# Patient Record
Sex: Female | Born: 1990 | Hispanic: No | Marital: Married | Smoking: Never smoker
Health system: Southern US, Community
[De-identification: ages and names within clinical notes are randomized; demographics above are authoritative.]

## PROBLEM LIST (undated history)

## (undated) ENCOUNTER — Inpatient Hospital Stay (HOSPITAL_COMMUNITY): Payer: Self-pay

## (undated) DIAGNOSIS — D649 Anemia, unspecified: Secondary | ICD-10-CM

## (undated) DIAGNOSIS — I341 Nonrheumatic mitral (valve) prolapse: Secondary | ICD-10-CM

## (undated) DIAGNOSIS — I471 Supraventricular tachycardia, unspecified: Secondary | ICD-10-CM

## (undated) DIAGNOSIS — I498 Other specified cardiac arrhythmias: Secondary | ICD-10-CM

## (undated) DIAGNOSIS — R001 Bradycardia, unspecified: Secondary | ICD-10-CM

## (undated) HISTORY — DX: Supraventricular tachycardia, unspecified: I47.10

## (undated) HISTORY — DX: Bradycardia, unspecified: R00.1

## (undated) HISTORY — DX: Nonrheumatic mitral (valve) prolapse: I34.1

## (undated) HISTORY — DX: Supraventricular tachycardia: I47.1

## (undated) HISTORY — DX: Other specified cardiac arrhythmias: I49.8

## (undated) HISTORY — PX: NO PAST SURGERIES: SHX2092

## (undated) HISTORY — DX: Anemia, unspecified: D64.9

---
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
## 2011-01-26 LAB — RUBELLA ANTIBODY, IGM: Rubella: IMMUNE

## 2011-01-26 LAB — ABO/RH: RH Type: POSITIVE

## 2011-01-26 LAB — RPR: RPR: NONREACTIVE

## 2011-01-26 LAB — ANTIBODY SCREEN: Antibody Screen: NEGATIVE

## 2011-01-26 LAB — HEPATITIS B SURFACE ANTIGEN: Hepatitis B Surface Ag: NEGATIVE

## 2011-04-12 ENCOUNTER — Encounter (HOSPITAL_COMMUNITY): Payer: Self-pay | Admitting: Anesthesiology

## 2011-04-12 ENCOUNTER — Other Ambulatory Visit: Payer: Self-pay | Admitting: Obstetrics and Gynecology

## 2011-04-12 ENCOUNTER — Inpatient Hospital Stay (HOSPITAL_COMMUNITY): Payer: Medicaid Other | Admitting: Anesthesiology

## 2011-04-12 ENCOUNTER — Encounter (HOSPITAL_COMMUNITY): Payer: Self-pay

## 2011-04-12 ENCOUNTER — Inpatient Hospital Stay (HOSPITAL_COMMUNITY)
Admission: AD | Admit: 2011-04-12 | Discharge: 2011-04-14 | DRG: 774 | Disposition: A | Discharge status: Home / Self Care | Payer: Medicaid Other | Source: Ambulatory Visit | Attending: Obstetrics and Gynecology | Admitting: Obstetrics and Gynecology

## 2011-04-12 ENCOUNTER — Encounter (HOSPITAL_COMMUNITY): Payer: Self-pay | Admitting: *Deleted

## 2011-04-12 DIAGNOSIS — Z3490 Encounter for supervision of normal pregnancy, unspecified, unspecified trimester: Secondary | ICD-10-CM

## 2011-04-12 DIAGNOSIS — D62 Acute posthemorrhagic anemia: Secondary | ICD-10-CM | POA: Diagnosis not present

## 2011-04-12 DIAGNOSIS — O9903 Anemia complicating the puerperium: Secondary | ICD-10-CM | POA: Diagnosis not present

## 2011-04-12 DIAGNOSIS — O99019 Anemia complicating pregnancy, unspecified trimester: Secondary | ICD-10-CM

## 2011-04-12 DIAGNOSIS — O0930 Supervision of pregnancy with insufficient antenatal care, unspecified trimester: Secondary | ICD-10-CM

## 2011-04-12 LAB — URINALYSIS, ROUTINE W REFLEX MICROSCOPIC
Glucose, UA: NEGATIVE mg/dL
pH: 6 (ref 5.0–8.0)

## 2011-04-12 LAB — CBC
MCH: 29.8 pg (ref 26.0–34.0)
MCHC: 34.4 g/dL (ref 30.0–36.0)
MCV: 86.7 fL (ref 78.0–100.0)
Platelets: 207 10*3/uL (ref 150–400)
RBC: 3.92 MIL/uL (ref 3.87–5.11)
RDW: 14.9 % (ref 11.5–15.5)

## 2011-04-12 LAB — COMPREHENSIVE METABOLIC PANEL
ALT: 13 U/L (ref 0–35)
Albumin: 3 g/dL — ABNORMAL LOW (ref 3.5–5.2)
Alkaline Phosphatase: 326 U/L — ABNORMAL HIGH (ref 39–117)
Glucose, Bld: 90 mg/dL (ref 70–99)
Potassium: 3.3 mEq/L — ABNORMAL LOW (ref 3.5–5.1)
Sodium: 139 mEq/L (ref 135–145)
Total Protein: 6.5 g/dL (ref 6.0–8.3)

## 2011-04-12 LAB — RPR: RPR Ser Ql: NONREACTIVE

## 2011-04-12 MED ORDER — EPHEDRINE 5 MG/ML INJ
10.0000 mg | INTRAVENOUS | Status: DC | PRN
  Filled 2011-04-12: qty 4

## 2011-04-12 MED ORDER — LIDOCAINE HCL (PF) 1 % IJ SOLN
30.0000 mL | INTRAMUSCULAR | Status: DC | PRN
  Filled 2011-04-12: qty 30

## 2011-04-12 MED ORDER — LIDOCAINE HCL 1.5 % IJ SOLN
INTRAMUSCULAR | Status: DC | PRN
  Administered 2011-04-12 (×2): 4 mL via EPIDURAL

## 2011-04-12 MED ORDER — EPHEDRINE 5 MG/ML INJ: 10.0000 mg | INTRAVENOUS | Status: DC | PRN

## 2011-04-12 MED ORDER — ONDANSETRON HCL 4 MG/2ML IJ SOLN: 4.0000 mg | Freq: Four times a day (QID) | INTRAMUSCULAR | Status: DC | PRN

## 2011-04-12 MED ORDER — FENTANYL 2.5 MCG/ML BUPIVACAINE 1/10 % EPIDURAL INFUSION (WH - ANES)
14.0000 mL/h | INTRAMUSCULAR | Status: DC
  Filled 2011-04-12: qty 60

## 2011-04-12 MED ORDER — DIPHENHYDRAMINE HCL 50 MG/ML IJ SOLN: 12.5000 mg | INTRAMUSCULAR | Status: DC | PRN

## 2011-04-12 MED ORDER — ACETAMINOPHEN 325 MG PO TABS: 650.0000 mg | ORAL_TABLET | ORAL | Status: DC | PRN

## 2011-04-12 MED ORDER — LACTATED RINGERS IV SOLN
INTRAVENOUS | Status: DC
  Administered 2011-04-12: 17:00:00 via INTRAVENOUS

## 2011-04-12 MED ORDER — MISOPROSTOL 200 MCG PO TABS
ORAL_TABLET | ORAL | Status: AC
  Administered 2011-04-12: 1000 ug
  Filled 2011-04-12: qty 5

## 2011-04-12 MED ORDER — IBUPROFEN 600 MG PO TABS: 600.0000 mg | ORAL_TABLET | Freq: Four times a day (QID) | ORAL | Status: DC | PRN

## 2011-04-12 MED ORDER — OXYTOCIN 10 UNIT/ML IJ SOLN: 10.0000 [IU] | Freq: Once | INTRAMUSCULAR | Status: DC

## 2011-04-12 MED ORDER — OXYTOCIN 10 UNIT/ML IJ SOLN
INTRAMUSCULAR | Status: AC
  Administered 2011-04-12: 10 [IU]
  Filled 2011-04-12: qty 2

## 2011-04-12 MED ORDER — CITRIC ACID-SODIUM CITRATE 334-500 MG/5ML PO SOLN: 30.0000 mL | ORAL | Status: DC | PRN

## 2011-04-12 MED ORDER — OXYTOCIN BOLUS FROM INFUSION
500.0000 mL | Freq: Once | INTRAVENOUS | Status: DC
  Filled 2011-04-12: qty 500
  Filled 2011-04-12: qty 1000

## 2011-04-12 MED ORDER — LACTATED RINGERS IV SOLN: 500.0000 mL | INTRAVENOUS | Status: DC | PRN

## 2011-04-12 MED ORDER — BUTORPHANOL TARTRATE 2 MG/ML IJ SOLN: 2.0000 mg | INTRAMUSCULAR | Status: DC | PRN

## 2011-04-12 MED ORDER — OXYCODONE-ACETAMINOPHEN 5-325 MG PO TABS: 2.0000 | ORAL_TABLET | ORAL | Status: DC | PRN

## 2011-04-12 MED ORDER — PHENYLEPHRINE 40 MCG/ML (10ML) SYRINGE FOR IV PUSH (FOR BLOOD PRESSURE SUPPORT)
80.0000 ug | PREFILLED_SYRINGE | INTRAVENOUS | Status: DC | PRN
  Filled 2011-04-12: qty 5

## 2011-04-12 MED ORDER — SODIUM CHLORIDE 0.9 % IJ SOLN: 3.0000 mL | Freq: Two times a day (BID) | INTRAMUSCULAR | Status: DC

## 2011-04-12 MED ORDER — FENTANYL 2.5 MCG/ML BUPIVACAINE 1/10 % EPIDURAL INFUSION (WH - ANES)
INTRAMUSCULAR | Status: DC | PRN
  Administered 2011-04-12: 14 mL/h via EPIDURAL

## 2011-04-12 MED ORDER — LACTATED RINGERS IV SOLN
500.0000 mL | Freq: Once | INTRAVENOUS | Status: AC
  Administered 2011-04-12: 500 mL via INTRAVENOUS

## 2011-04-12 MED ORDER — PHENYLEPHRINE 40 MCG/ML (10ML) SYRINGE FOR IV PUSH (FOR BLOOD PRESSURE SUPPORT)
80.0000 ug | PREFILLED_SYRINGE | INTRAVENOUS | Status: DC | PRN
  Filled 2011-04-12 (×2): qty 5

## 2011-04-12 MED ORDER — OXYTOCIN 20 UNITS IN LACTATED RINGERS INFUSION - SIMPLE: 125.0000 mL/h | Freq: Once | INTRAVENOUS | Status: DC

## 2011-04-12 NOTE — H&P (Signed)
Katelyn Fuentes is a 20 y.o. female presenting for CC of SROM at 1330, states she had a large gush of clear fluid and then continued to have leaking, appears grossly ruptured and amnisure is pos. She reports ctx are coming more frequent and are stronger now, she reports scant bloody show and pos FM.  Pregnancy has been uncomplicated.   HPI: She was seen at [facility] for prenatal care at 27wks, she had a routine anatomy scan and then a repeat anatomy for additional views, it was otherwise normal. She did not have any other previous [facility] aside from an US done in the ER in [place], that confirmed EDC of 12/27. She had a 1hr gtt that was normal.   Maternal Medical History:  Reason for admission: Reason for admission: rupture of membranes.  srom clear fluid at 1330  Contractions: Onset was 1-2 hours ago.   Frequency: regular.   Perceived severity is moderate.    Fetal activity: Perceived fetal activity is normal.   Last perceived fetal movement was within the past hour.    Prenatal complications: no prenatal complications   OB History    Grav Para Term Preterm Abortions TAB SAB Ect Mult Living   1 0             Past Medical History  Diagnosis Date  . Asthma     childhood   Past Surgical History  Procedure Date  . No past surgeries    Family History: family history is not on file. Heart dx - MGM Father - MI CHTN - MGM Diabetes - MA Seizures/stroke - PGF, PGM, MGM,  Social History:  reports that she has never smoked. She has never used smokeless tobacco. She reports that she does not drink alcohol or use illicit drugs. Pt is single Building control surveyor, "Katelyn Fuentes" FOB is involved and supportive. She is currently unemployed with 1 yr college education.   Review of Systems  All other systems reviewed and are negative.    Dilation: 3 Effacement (%): 100 Station: 0 Exam by:: Shelly Jahanna Raether, cnm Blood pressure 143/91, pulse 81, temperature 98.3 F (36.8 C), temperature  source Oral, resp. rate 18, height 5\' 5"  (1.651 m), weight 67.586 kg (149 lb). Maternal Exam:  Uterine Assessment: Contraction strength is moderate.  Contraction frequency is regular.   Abdomen: Fundal height is aga.   Estimated fetal weight is 7-11.   Fetal presentation: vertex  Introitus: Normal vulva. Normal vagina.  Ferning test: not done.  Nitrazine test: not done. Amniotic fluid character: clear. Copious clear fluid, amniosure pos  Pelvis: adequate for delivery.   Cervix: Cervix evaluated by digital exam.     Fetal Exam Fetal Monitor Review: Mode: ultrasound.   Baseline rate: 140.  Variability: moderate (6-25 bpm).   Pattern: accelerations present and early decelerations.    Fetal State Assessment: Category I - tracings are normal.     Physical Exam  Nursing note and vitals reviewed. Constitutional: She is oriented to person, place, and time. She appears well-developed and well-nourished. No distress.       Grimaces with some ctx, responded to coaching and relaxation techniques  Neck: Normal range of motion.  Cardiovascular: Normal rate.   Respiratory: Effort normal.  GI: Soft. There is no tenderness.  Genitourinary: Vagina normal.       Pelvic =3/100/0 vtx  Musculoskeletal: Normal range of motion. She exhibits no edema.  Neurological: She is alert and oriented to person, place, and time. She has normal reflexes.  Skin: Skin is warm and dry.  Psychiatric: She has a normal mood and affect. Her behavior is normal. Judgment and thought content normal.    Prenatal labs: ABO, Rh: B/Positive/-- (09/27 0000) Antibody: Negative (09/27 0000) Rubella: Immune (09/27 0000) RPR: Nonreactive (09/27 0000)  HBsAg: Negative (09/27 0000)  HIV: Non-reactive (09/27 0000)  GBS:   neg 1hr gtt =134   Assessment/Plan: IUP at [redacted]w[redacted]d SROM/early labor GBS neg FHR reassuring with occ mild early variables  Admit to birthing suites Routine CNM orders - Dr Stefano Gaul attending    Stadol PRN Anticipate NSVD   Brailey Buescher M 04/12/2011, 5:11 PM

## 2011-04-12 NOTE — Progress Notes (Addendum)
Patient ID: SRAH AKE, female   DOB: 28-Apr-1991, 20 y.o.   MRN: [id number] .Subjective:  Doing well, has been ambulating and using ball, desires epidural, family supportive at bs  Objective: BP 144/81  Pulse 77  Temp(Src) 98.1 F (36.7 C) (Oral)  Resp 18  Ht 5\' 5"  (1.651 m)  Wt 67.586 kg (149 lb)  BMI 24.79 kg/m2   FHT:  FHR: 120 bpm, variability: moderate,  accelerations:  Present,  decelerations:  Present occ early mild variables UC:   regular, every 2-4 minutes SVE:   Dilation: 3 Effacement (%): 100 Station: 0 Exam by:: [employer], cnm    Assessment / Plan: Spontaneous labor, progressing normally GBS neg Borderline BP's Platelets 207 PIH labs pending   Fetal Wellbeing:  Category I Pain Control:  Labor support without medications and Epidural  Will continue to observe BP closeley  Dr Stefano Gaul updated  Malissa Hippo 04/12/2011, 6:53 PM

## 2011-04-12 NOTE — Progress Notes (Signed)
Patient ID: LIANNA SITZMANN, female   DOB: 03/08/1991, 20 y.o.   MRN: [id number] .Subjective: Comfortable now with epidural,    Objective: BP 139/88  Pulse 89  Temp(Src) 98.4 F (36.9 C) (Oral)  Resp 18  Ht 5\' 5"  (1.651 m)  Wt 67.586 kg (149 lb)  BMI 24.79 kg/m2  SpO2 100%     FHT:  FHR: 130 bpm, variability: moderate,  accelerations:  Present,  decelerations:  Absent UC:   regular, every 2-3 minutes SVE:   Dilation: 9 Effacement (%): 100 Station: 0 Exam by:: Almond Lint, CNM    Assessment / Plan: Spontaneous labor, progressing normally Mildly elevated BP, PIH labs normal Will collect 24hr urine Recheck cervix 1-2hr   Fetal Wellbeing:  Category I Pain Control:  Epidural  Update physician PRN  Malissa Hippo 04/12/2011, 8:14 PM

## 2011-04-12 NOTE — Anesthesia Preprocedure Evaluation (Signed)
Anesthesia Evaluation  Patient identified by MRN, date of birth, ID band Patient awake    Reviewed: Allergy & Precautions, H&P , Patient's Chart, lab work & pertinent test results  Airway Mallampati: III TM Distance: >3 FB Neck ROM: full    Dental No notable dental hx. (+) Teeth Intact   Pulmonary neg pulmonary ROS,  clear to auscultation  Pulmonary exam normal       Cardiovascular neg cardio ROS regular Normal    Neuro/Psych Negative Neurological ROS  Negative Psych ROS   GI/Hepatic negative GI ROS, Neg liver ROS,   Endo/Other  Negative Endocrine ROS  Renal/GU negative Renal ROS  Genitourinary negative   Musculoskeletal   Abdominal Normal abdominal exam  (+)   Peds  Hematology negative hematology ROS (+)   Anesthesia Other Findings   Reproductive/Obstetrics (+) Pregnancy                           Anesthesia Physical Anesthesia Plan  ASA: II  Anesthesia Plan: Epidural   Post-op Pain Management:    Induction:   Airway Management Planned:   Additional Equipment:   Intra-op Plan:   Post-operative Plan:   Informed Consent: I have reviewed the patients History and Physical, chart, labs and discussed the procedure including the risks, benefits and alternatives for the proposed anesthesia with the patient or authorized representative who has indicated his/her understanding and acceptance.     Plan Discussed with: Anesthesiologist  Anesthesia Plan Comments:         Anesthesia Quick Evaluation

## 2011-04-12 NOTE — Anesthesia Procedure Notes (Signed)
Epidural Patient location during procedure: OB Start time: 04/12/2011 6:55 PM  Staffing Anesthesiologist: Lyna Laningham A. Performed by: anesthesiologist   Preanesthetic Checklist Completed: patient identified, site marked, surgical consent, pre-op evaluation, timeout performed, IV checked, risks and benefits discussed and monitors and equipment checked  Epidural Patient position: sitting Prep: site prepped and draped and DuraPrep Patient monitoring: continuous pulse ox and blood pressure Approach: midline Injection technique: LOR air  Needle:  Needle type: Tuohy  Needle gauge: 17 G Needle length: 9 cm Needle insertion depth: 4 cm Catheter type: closed end flexible Catheter size: 19 Gauge Catheter at skin depth: 9 cm Test dose: negative and 1.5% lidocaine  Assessment Events: blood not aspirated, injection not painful, no injection resistance, negative IV test and no paresthesia  Additional Notes Patient is more comfortable after epidural dosed. Please see RN's note for documentation of vital signs and FHR which are stable.

## 2011-04-13 LAB — CBC
HCT: 24.4 % — ABNORMAL LOW (ref 36.0–46.0)
Hemoglobin: 8.4 g/dL — ABNORMAL LOW (ref 12.0–15.0)
MCV: 86.8 fL (ref 78.0–100.0)
RDW: 14.7 % (ref 11.5–15.5)
WBC: 12.2 10*3/uL — ABNORMAL HIGH (ref 4.0–10.5)

## 2011-04-13 MED ORDER — LANOLIN HYDROUS EX OINT: TOPICAL_OINTMENT | CUTANEOUS | Status: DC | PRN

## 2011-04-13 MED ORDER — MEASLES, MUMPS & RUBELLA VAC ~~LOC~~ INJ
0.5000 mL | INJECTION | Freq: Once | SUBCUTANEOUS | Status: DC
  Filled 2011-04-13: qty 0.5

## 2011-04-13 MED ORDER — OXYCODONE-ACETAMINOPHEN 5-325 MG PO TABS: 1.0000 | ORAL_TABLET | ORAL | Status: DC | PRN

## 2011-04-13 MED ORDER — ZOLPIDEM TARTRATE 5 MG PO TABS: 5.0000 mg | ORAL_TABLET | Freq: Every evening | ORAL | Status: DC | PRN

## 2011-04-13 MED ORDER — SIMETHICONE 80 MG PO CHEW: 80.0000 mg | CHEWABLE_TABLET | ORAL | Status: DC | PRN

## 2011-04-13 MED ORDER — SENNOSIDES-DOCUSATE SODIUM 8.6-50 MG PO TABS
2.0000 | ORAL_TABLET | Freq: Every day | ORAL | Status: DC
  Administered 2011-04-13: 2 via ORAL

## 2011-04-13 MED ORDER — DIBUCAINE 1 % RE OINT: 1.0000 "application " | TOPICAL_OINTMENT | RECTAL | Status: DC | PRN

## 2011-04-13 MED ORDER — IBUPROFEN 600 MG PO TABS
600.0000 mg | ORAL_TABLET | Freq: Four times a day (QID) | ORAL | Status: DC
  Administered 2011-04-13 – 2011-04-14 (×5): 600 mg via ORAL
  Filled 2011-04-13 (×5): qty 1

## 2011-04-13 MED ORDER — OXYTOCIN 20 UNITS IN LACTATED RINGERS INFUSION - SIMPLE: 999.0000 mL/h | INTRAVENOUS | Status: DC

## 2011-04-13 MED ORDER — PRENATAL PLUS 27-1 MG PO TABS
1.0000 | ORAL_TABLET | Freq: Every day | ORAL | Status: DC
  Administered 2011-04-13 – 2011-04-14 (×2): 1 via ORAL
  Filled 2011-04-13 (×2): qty 1

## 2011-04-13 MED ORDER — BENZOCAINE-MENTHOL 20-0.5 % EX AERO: 1.0000 "application " | INHALATION_SPRAY | CUTANEOUS | Status: DC | PRN

## 2011-04-13 MED ORDER — WITCH HAZEL-GLYCERIN EX PADS: 1.0000 "application " | MEDICATED_PAD | CUTANEOUS | Status: DC | PRN

## 2011-04-13 MED ORDER — MISOPROSTOL 200 MCG PO TABS
1000.0000 ug | ORAL_TABLET | Freq: Once | ORAL | Status: DC
  Filled 2011-04-13: qty 5

## 2011-04-13 MED ORDER — TETANUS-DIPHTH-ACELL PERTUSSIS 5-2.5-18.5 LF-MCG/0.5 IM SUSP: 0.5000 mL | Freq: Once | INTRAMUSCULAR | Status: DC

## 2011-04-13 MED ORDER — DIPHENHYDRAMINE HCL 25 MG PO CAPS: 25.0000 mg | ORAL_CAPSULE | Freq: Four times a day (QID) | ORAL | Status: DC | PRN

## 2011-04-13 MED ORDER — ONDANSETRON HCL 4 MG/2ML IJ SOLN: 4.0000 mg | INTRAMUSCULAR | Status: DC | PRN

## 2011-04-13 MED ORDER — FERROUS SULFATE 325 (65 FE) MG PO TABS
325.0000 mg | ORAL_TABLET | Freq: Two times a day (BID) | ORAL | Status: DC
  Administered 2011-04-13 – 2011-04-14 (×2): 325 mg via ORAL
  Filled 2011-04-13 (×2): qty 1

## 2011-04-13 MED ORDER — ONDANSETRON HCL 4 MG PO TABS: 4.0000 mg | ORAL_TABLET | ORAL | Status: DC | PRN

## 2011-04-13 NOTE — Anesthesia Postprocedure Evaluation (Signed)
Anesthesia Post Note  Patient: Katelyn Fuentes  Procedure(s) Performed: * No procedures listed *  Anesthesia type: Epidural  Patient location: Mother/Baby  Post pain: Pain level controlled  Post assessment: Post-op Vital signs reviewed  Last Vitals:  Filed Vitals:   04/13/11 1447  BP: 108/74  Pulse: 118  Temp:   Resp:     Post vital signs: Reviewed  Level of consciousness: awake  Complications: No apparent anesthesia complications

## 2011-04-13 NOTE — Progress Notes (Signed)
Post Partum Day 1 Subjective: complaints of a little dizzy when up no shower yet  Objective: Blood pressure 100/65, pulse 101, temperature 98.7 F (37.1 C), temperature source Oral, resp. rate 18, height 5\' 5"  (1.651 m), weight 67.586 kg (149 lb), SpO2 99.00%, unknown if currently breastfeeding.  Physical Exam:  General: alert and cooperative Lochia: appropriate Uterine Fundus: firm Incision:none DVT Evaluation: Negative Homan's sign.   Basename 04/13/11 0515 04/12/11 1725  HGB 8.4* 11.7*  HCT 24.4* 34.0*    Assessment/Plan: Hx anemia 2ndary to PP hemorrhage Hemodynamically stable Plan  Orthostatic bps, hgb in am, iron   LOS: 1 day   Ahna Konkle 04/13/2011, 12:56 PM

## 2011-04-13 NOTE — Progress Notes (Signed)
UR Chart review completed.  

## 2011-04-14 DIAGNOSIS — O99019 Anemia complicating pregnancy, unspecified trimester: Secondary | ICD-10-CM

## 2011-04-14 LAB — CBC
Hemoglobin: 7.6 g/dL — ABNORMAL LOW (ref 12.0–15.0)
MCH: 30 pg (ref 26.0–34.0)
MCHC: 34.2 g/dL (ref 30.0–36.0)

## 2011-04-14 MED ORDER — DESOGESTREL-ETHINYL ESTRADIOL 0.15-0.02/0.01 MG (21/5) PO TABS: 1.0000 | ORAL_TABLET | Freq: Every day | ORAL | Status: DC

## 2011-04-14 MED ORDER — IBUPROFEN 600 MG PO TABS: 600.0000 mg | ORAL_TABLET | Freq: Four times a day (QID) | ORAL | Status: AC | PRN

## 2011-04-14 NOTE — Discharge Summary (Signed)
Obstetric Discharge Summary Reason for Admission: onset of labor Prenatal Procedures: ultrasound Intrapartum Procedures: spontaneous vaginal delivery Postpartum Procedures: none Complications-Operative and Postpartum: hemorrhage and anemia Hemoglobin  Date Value Range Status  04/14/2011 7.6* 12.0-15.0 (g/dL) Final     HCT  Date Value Range Status  04/14/2011 22.2* 36.0-46.0 (%) Final   Hospital course: spontaneous labor, SVD, with pp hemorrhage, no laceration, nonlactating, offered blood transfusion, declined, PE WNL, Using PO pain medication with benefit. Discharge Diagnoses: Term Pregnancy-delivered and anemia  Discharge Information: Date: 04/14/2011 Activity: pelvic rest Diet: routine Medications: Ibuprofen, Iron and Kariva Condition: stable Instructions: refer to practice specific booklet Discharge to: home Contraception: OCP Follow-up Information    Follow up with [facility]. Make an appointment in 6 weeks.         Newborn Data: Live born female  Birth Weight: 6 lb 15.6 oz (3165 g) APGAR: 8, 9  Home with mother.  Nigel Bridgeman 04/14/2011, 11:41 AM

## 2011-04-14 NOTE — Addendum Note (Signed)
Addendum  created 04/14/11 1610 by Cephus Shelling   Modules edited:Charges VN, Notes Section

## 2011-04-14 NOTE — Addendum Note (Signed)
Addendum  created 04/14/11 0820 by Cephus Shelling   Modules edited:Charges VN, Notes Section

## 2011-04-14 NOTE — Anesthesia Postprocedure Evaluation (Signed)
  Anesthesia Post Note  Patient: Katelyn Fuentes  Procedure(s) Performed: * No procedures listed *  Anesthesia type: Epidural  Patient location: Mother/Baby  Post pain: Pain level controlled  Post assessment: Post-op Vital signs reviewed  Last Vitals:  Filed Vitals:   04/14/11 0558  BP: 106/69  Pulse: 86  Temp: 36.6 C  Resp: 18    Post vital signs: Reviewed  Level of consciousness:alert  Complications: No apparent anesthesia complications

## 2011-06-27 ENCOUNTER — Encounter (INDEPENDENT_AMBULATORY_CARE_PROVIDER_SITE_OTHER): Payer: Medicaid Other | Admitting: Obstetrics and Gynecology

## 2011-06-27 DIAGNOSIS — Z30017 Encounter for initial prescription of implantable subdermal contraceptive: Secondary | ICD-10-CM

## 2011-06-27 DIAGNOSIS — Z3049 Encounter for surveillance of other contraceptives: Secondary | ICD-10-CM

## 2011-07-05 ENCOUNTER — Encounter: Payer: Medicaid Other | Admitting: Obstetrics and Gynecology

## 2011-11-18 ENCOUNTER — Encounter (HOSPITAL_COMMUNITY): Payer: Self-pay | Admitting: Emergency Medicine

## 2011-11-18 ENCOUNTER — Emergency Department (HOSPITAL_COMMUNITY)
Admission: EM | Admit: 2011-11-18 | Discharge: 2011-11-18 | Disposition: A | Discharge status: Home / Self Care | Payer: Medicaid Other | Attending: Emergency Medicine | Admitting: Emergency Medicine

## 2011-11-18 DIAGNOSIS — K120 Recurrent oral aphthae: Secondary | ICD-10-CM | POA: Insufficient documentation

## 2011-11-18 MED ORDER — LIDOCAINE VISCOUS 2 % MT SOLN: 20.0000 mL | OROMUCOSAL | Status: AC | PRN

## 2011-11-18 NOTE — ED Notes (Signed)
Pt reports a bump to right inner jaw on Wednesday that has increased in size.  Pt denies drainage to lesion.  Pt denies chills or fever.  Pt reports itching to outer right cheek.

## 2011-11-18 NOTE — ED Notes (Signed)
Pt lesions to right inner jaw

## 2011-11-18 NOTE — ED Notes (Signed)
Pt verbalizes understanding 

## 2011-11-18 NOTE — ED Notes (Signed)
Pain right side inner cheek, on exam, pt has small ulcerated area adjacent to lower wisdom tooth. Rx'ed w/ Advill and Aleve, as well as orajel extra strenght, w/o relief.

## 2011-11-22 NOTE — ED Provider Notes (Signed)
History/physical exam/procedure(s) were performed by non-physician practitioner and as supervising physician I was immediately available for consultation/collaboration. I have reviewed all notes and am in agreement with care and plan.   Brewer Hitchman S Mattie Novosel, MD 11/22/11 1514 

## 2011-11-22 NOTE — ED Provider Notes (Signed)
History     CSN: [id number]  Arrival date & time 11/18/11  1359   First MD Initiated Contact with Patient 11/18/11 1633      Chief Complaint  Patient presents with  . Mouth Lesions    (Consider location/radiation/quality/duration/timing/severity/associated sxs/prior treatment) Patient is a 21 y.o. female presenting with mouth sores. The history is provided by the patient and a friend.  Mouth Lesions  The current episode started 5 to 7 days ago. The onset was gradual. The problem occurs rarely. The problem has been gradually worsening. The problem is mild. The symptoms are relieved by one or more OTC medications. The symptoms are aggravated by eating. Associated symptoms include mouth sores. Pertinent negatives include no fever, no nausea, no vomiting, no sore throat, no swollen glands and no neck pain.  21yo female with canker sore to R inner cheek x several days. Used oragel and aleve with minimal relief.  No fever n/v.  Nontoxic appearance.   Past Medical History  Diagnosis Date  . Asthma     childhood    Past Surgical History  Procedure Date  . No past surgeries     No family history on file.  History  Substance Use Topics  . Smoking status: Never Smoker   . Smokeless tobacco: Never Used  . Alcohol Use: No    OB History    Grav Para Term Preterm Abortions TAB SAB Ect Mult Living   1 1 1       1       Review of Systems  Constitutional: Negative for fever.  HENT: Positive for mouth sores. Negative for sore throat, trouble swallowing and neck pain.   Gastrointestinal: Negative for nausea and vomiting.    Allergies  Peach seed  Home Medications   Current Outpatient Rx  Name Route Sig Dispense Refill  . NAPROXEN SODIUM 220 MG PO TABS Oral Take 220 mg by mouth as needed. Pain    . LIDOCAINE VISCOUS 2 % MT SOLN Oral Take 20 mLs by mouth as needed for pain (swish and spit before eating). 100 mL 0    BP 109/58  Pulse 55  Temp 97.9 F (36.6 C) (Oral)  Resp  12  SpO2 100%  LMP 10/29/2011  Breastfeeding? No  Physical Exam  HENT:  Head: Normocephalic and atraumatic.  Mouth/Throat: Uvula is midline, oropharynx is clear and moist and mucous membranes are normal.    ED Course  Procedures (including critical care time)  Labs Reviewed - No data to display No results found.   1. Canker sores oral       MDM  Canker sore x several days. Contiue advil and use lidocaine 2% prior to eating.  Follow up with pcp of choice as needed.        Remi Haggard, NP 11/22/11 1338

## 2012-02-16 ENCOUNTER — Telehealth: Payer: Self-pay | Admitting: Obstetrics and Gynecology

## 2012-02-21 ENCOUNTER — Encounter: Payer: Medicaid Other | Admitting: Obstetrics and Gynecology

## 2012-09-24 ENCOUNTER — Encounter (HOSPITAL_COMMUNITY): Payer: Self-pay | Admitting: Emergency Medicine

## 2012-09-24 ENCOUNTER — Emergency Department (HOSPITAL_COMMUNITY)
Admission: EM | Admit: 2012-09-24 | Discharge: 2012-09-24 | Disposition: A | Discharge status: Home / Self Care | Payer: Medicaid Other | Attending: Emergency Medicine | Admitting: Emergency Medicine

## 2012-09-24 DIAGNOSIS — Z3202 Encounter for pregnancy test, result negative: Secondary | ICD-10-CM | POA: Insufficient documentation

## 2012-09-24 DIAGNOSIS — R3915 Urgency of urination: Secondary | ICD-10-CM | POA: Insufficient documentation

## 2012-09-24 DIAGNOSIS — J45909 Unspecified asthma, uncomplicated: Secondary | ICD-10-CM | POA: Insufficient documentation

## 2012-09-24 DIAGNOSIS — Z791 Long term (current) use of non-steroidal anti-inflammatories (NSAID): Secondary | ICD-10-CM | POA: Insufficient documentation

## 2012-09-24 DIAGNOSIS — R350 Frequency of micturition: Secondary | ICD-10-CM | POA: Insufficient documentation

## 2012-09-24 DIAGNOSIS — N390 Urinary tract infection, site not specified: Secondary | ICD-10-CM | POA: Insufficient documentation

## 2012-09-24 DIAGNOSIS — Z79899 Other long term (current) drug therapy: Secondary | ICD-10-CM | POA: Insufficient documentation

## 2012-09-24 DIAGNOSIS — R319 Hematuria, unspecified: Secondary | ICD-10-CM | POA: Insufficient documentation

## 2012-09-24 LAB — URINE MICROSCOPIC-ADD ON

## 2012-09-24 LAB — URINALYSIS, ROUTINE W REFLEX MICROSCOPIC
Glucose, UA: NEGATIVE mg/dL
Ketones, ur: NEGATIVE mg/dL
Protein, ur: 100 mg/dL — AB

## 2012-09-24 MED ORDER — CEPHALEXIN 500 MG PO CAPS: 500.0000 mg | ORAL_CAPSULE | Freq: Four times a day (QID) | ORAL | Status: DC

## 2012-09-24 NOTE — ED Provider Notes (Signed)
History     CSN: [id number]  Arrival date & time 09/24/12  1207   First MD Initiated Contact with Patient 09/24/12 1321      Chief Complaint  Patient presents with  . Dysuria    (Consider location/radiation/quality/duration/timing/severity/associated sxs/prior treatment) HPI Comments: 22 year old female presents to the emergency department complaining of increased urinary frequency, urgency and dysuria x10 days. Admits to associated suprapubic discomfort upon urination. Initially her lower back was hurting, rated 6/10, however this symptom has since began to resolve. She has not tried any alleviating factors for her pain. States she is urinating twice every hour. Admits to a few episodes of hematuria. No nausea or vomiting, fever or chills. She is currently on her menstrual cycle. Denies vaginal bleeding, pain or discharge.  Patient is a 22 y.o. female presenting with dysuria. The history is provided by the patient.  Dysuria Associated symptoms: no fever, no nausea, no vaginal discharge and no vomiting     Past Medical History  Diagnosis Date  . Asthma     childhood    Past Surgical History  Procedure Laterality Date  . No past surgeries      No family history on file.  History  Substance Use Topics  . Smoking status: Never Smoker   . Smokeless tobacco: Never Used  . Alcohol Use: No    OB History   Grav Para Term Preterm Abortions TAB SAB Ect Mult Living   1 1 1       1       Review of Systems  Constitutional: Negative for fever and chills.  Gastrointestinal: Negative for nausea and vomiting.  Genitourinary: Positive for dysuria, urgency, frequency and hematuria. Negative for vaginal discharge and vaginal pain.  All other systems reviewed and are negative.    Allergies  Peach seed  Home Medications   Current Outpatient Rx  Name  Route  Sig  Dispense  Refill  . ferrous sulfate 325 (65 FE) MG tablet   Oral   Take 325 mg by mouth daily with breakfast.          . naproxen sodium (ANAPROX) 220 MG tablet   Oral   Take 220 mg by mouth as needed. Pain         . prenatal vitamin w/FE, FA (PRENATAL 1 + 1) 27-1 MG TABS   Oral   Take 1 tablet by mouth daily at 12 noon.           BP 110/66  Pulse 84  Temp(Src) 98.5 F (36.9 C) (Oral)  Resp 16  SpO2 100%  LMP 09/24/2012  Physical Exam  Nursing note and vitals reviewed. Constitutional: She is oriented to person, place, and time. She appears well-developed and well-nourished. No distress.  HENT:  Head: Normocephalic and atraumatic.  Mouth/Throat: Oropharynx is clear and moist.  Eyes: Conjunctivae are normal.  Neck: Normal range of motion. Neck supple.  Cardiovascular: Normal rate, regular rhythm and normal heart sounds.   Pulmonary/Chest: Effort normal and breath sounds normal.  Abdominal: Soft. Normal appearance and bowel sounds are normal. She exhibits no distension and no mass. There is tenderness in the suprapubic area. There is CVA tenderness (bilateral, mild). There is no rigidity, no rebound and no guarding.  Musculoskeletal: Normal range of motion. She exhibits no edema.  Neurological: She is alert and oriented to person, place, and time.  Skin: Skin is warm and dry. She is not diaphoretic.  Psychiatric: She has a normal mood and affect. Her behavior  is normal.    ED Course  Procedures (including critical care time)  Labs Reviewed  URINALYSIS, ROUTINE W REFLEX MICROSCOPIC - Abnormal; Notable for the following:    Color, Urine AMBER (*)    APPearance TURBID (*)    Hgb urine dipstick LARGE (*)    Protein, ur 100 (*)    Leukocytes, UA LARGE (*)    All other components within normal limits  URINE MICROSCOPIC-ADD ON - Abnormal; Notable for the following:    Squamous Epithelial / LPF FEW (*)    Bacteria, UA FEW (*)    All other components within normal limits  URINE CULTURE  POCT PREGNANCY, URINE   No results found.   1. Urinary tract infection       MDM   22 year old female with uncomplicated urinary tract infection. She is in no apparent distress with normal vital signs. Rx Keflex. Advised her to stay well-hydrated. Return precautions discussed. She will followup with her primary care physician in one week. Patient states understanding of plan and is agreeable.        Trevor Mace, PA-C 09/24/12 1410

## 2012-09-24 NOTE — ED Notes (Signed)
Pt states that she has been having dysuria and frequency x over a week.

## 2012-09-24 NOTE — ED Provider Notes (Signed)
Medical screening examination/treatment/procedure(s) were performed by non-physician practitioner and as supervising physician I was immediately available for consultation/collaboration.  Gilda Crease, MD 09/24/12 [phone]

## 2012-09-26 LAB — URINE CULTURE

## 2012-09-27 NOTE — ED Notes (Signed)
Post ED Visit - Positive Culture Follow-up  Culture report reviewed by antimicrobial stewardship pharmacist: []  Wes Dulaney, Pharm.D., BCPS []  Celedonio Miyamoto, Pharm.D., BCPS [x]  Georgina Pillion, [address].D., BCPS []  [place], [address].D., BCPS, AAHIVP []  Estella Husk, Pharm.D., BCPS, AAHIV  Positive urine culture Treated with cephalexin, organism sensitive to the same and no further patient follow-up is required at this time.  Larena Sox 09/27/2012, 3:01 PM

## 2012-12-17 DIAGNOSIS — F419 Anxiety disorder, unspecified: Secondary | ICD-10-CM | POA: Insufficient documentation

## 2012-12-17 DIAGNOSIS — F32A Depression, unspecified: Secondary | ICD-10-CM | POA: Insufficient documentation

## 2012-12-17 DIAGNOSIS — F329 Major depressive disorder, single episode, unspecified: Secondary | ICD-10-CM | POA: Insufficient documentation

## 2014-03-02 ENCOUNTER — Encounter (HOSPITAL_COMMUNITY): Payer: Self-pay | Admitting: Emergency Medicine

## 2014-12-04 ENCOUNTER — Emergency Department (HOSPITAL_COMMUNITY): Payer: Medicaid Other

## 2014-12-04 ENCOUNTER — Encounter (HOSPITAL_COMMUNITY): Payer: Self-pay | Admitting: Emergency Medicine

## 2014-12-04 ENCOUNTER — Emergency Department (HOSPITAL_COMMUNITY)
Admission: EM | Admit: 2014-12-04 | Discharge: 2014-12-04 | Disposition: A | Discharge status: Home / Self Care | Payer: Medicaid Other | Attending: Emergency Medicine | Admitting: Emergency Medicine

## 2014-12-04 DIAGNOSIS — R55 Syncope and collapse: Secondary | ICD-10-CM | POA: Insufficient documentation

## 2014-12-04 DIAGNOSIS — R110 Nausea: Secondary | ICD-10-CM | POA: Insufficient documentation

## 2014-12-04 DIAGNOSIS — R531 Weakness: Secondary | ICD-10-CM | POA: Diagnosis not present

## 2014-12-04 DIAGNOSIS — Z792 Long term (current) use of antibiotics: Secondary | ICD-10-CM | POA: Diagnosis not present

## 2014-12-04 DIAGNOSIS — Z3202 Encounter for pregnancy test, result negative: Secondary | ICD-10-CM | POA: Insufficient documentation

## 2014-12-04 DIAGNOSIS — R51 Headache: Secondary | ICD-10-CM | POA: Diagnosis not present

## 2014-12-04 DIAGNOSIS — Z79899 Other long term (current) drug therapy: Secondary | ICD-10-CM | POA: Diagnosis not present

## 2014-12-04 DIAGNOSIS — R0789 Other chest pain: Secondary | ICD-10-CM | POA: Diagnosis not present

## 2014-12-04 DIAGNOSIS — R42 Dizziness and giddiness: Secondary | ICD-10-CM | POA: Diagnosis not present

## 2014-12-04 DIAGNOSIS — J45909 Unspecified asthma, uncomplicated: Secondary | ICD-10-CM | POA: Insufficient documentation

## 2014-12-04 DIAGNOSIS — R002 Palpitations: Secondary | ICD-10-CM

## 2014-12-04 DIAGNOSIS — R0781 Pleurodynia: Secondary | ICD-10-CM | POA: Diagnosis not present

## 2014-12-04 DIAGNOSIS — R9431 Abnormal electrocardiogram [ECG] [EKG]: Secondary | ICD-10-CM

## 2014-12-04 LAB — BASIC METABOLIC PANEL
Anion gap: 8 (ref 5–15)
BUN: 7 mg/dL (ref 6–20)
CALCIUM: 9.5 mg/dL (ref 8.9–10.3)
CHLORIDE: 105 mmol/L (ref 101–111)
CO2: 24 mmol/L (ref 22–32)
Creatinine, Ser: 0.76 mg/dL (ref 0.44–1.00)
GLUCOSE: 101 mg/dL — AB (ref 65–99)
Potassium: 3.5 mmol/L (ref 3.5–5.1)
Sodium: 137 mmol/L (ref 135–145)

## 2014-12-04 LAB — I-STAT TROPONIN, ED: Troponin i, poc: 0 ng/mL (ref 0.00–0.08)

## 2014-12-04 LAB — CBC
HEMATOCRIT: 38.2 % (ref 36.0–46.0)
HEMOGLOBIN: 13 g/dL (ref 12.0–15.0)
MCH: 29.3 pg (ref 26.0–34.0)
MCHC: 34 g/dL (ref 30.0–36.0)
MCV: 86 fL (ref 78.0–100.0)
PLATELETS: 237 10*3/uL (ref 150–400)
RBC: 4.44 MIL/uL (ref 3.87–5.11)
RDW: 13.4 % (ref 11.5–15.5)
WBC: 3.4 10*3/uL — AB (ref 4.0–10.5)

## 2014-12-04 LAB — I-STAT BETA HCG BLOOD, ED (MC, WL, AP ONLY): I-stat hCG, quantitative: <5 m[IU]/mL (ref <5)

## 2014-12-04 LAB — D-DIMER, QUANTITATIVE: D-Dimer, Quant: <0.27 ug/mL-FEU (ref 0.00–0.48)

## 2014-12-04 MED ORDER — ACETAMINOPHEN 325 MG PO TABS
325.0000 mg | ORAL_TABLET | Freq: Once | ORAL | Status: AC
  Administered 2014-12-04: 325 mg via ORAL
  Filled 2014-12-04: qty 1

## 2014-12-04 NOTE — Discharge Instructions (Signed)
Return to emergency department immediately if you experience heart palpitations, lightheadedness or loss of consciousness.  Follow up with [facility] on Monday for holter monitor and echocardiogram.

## 2014-12-04 NOTE — Consult Note (Signed)
CARDIOLOGY CONSULT NOTE   Patient ID: LUCEIL HERRIN MRN: [id number], DOB/AGE: 05-13-1990   Admit date: 12/04/2014 Date of Consult: 12/04/2014  Primary Physician: Patrick Jupiter Primary Cardiologist: New  Reason for consult:  Palpitation  Problem List  Past Medical History  Diagnosis Date  . Asthma     childhood    Past Surgical History  Procedure Laterality Date  . No past surgeries      Allergies  Allergies  Allergen Reactions  . Peach Seed Hives    peaches   HPI   Ms. Providence Lanius is a 24 year old female presenting with palpitations that started about a week ago. She describes daily episodes of a feeling that "her heart alternating between fast and slow". The episodes began 1 week ago and would last for a few minutes. Associated symptoms include "tunnel vision", cold sweats, chills and generalized weakness. After the episodes, there is chest tightness and headaches that lasts for about an hour. She reports "fainting" with two of the episode. One fainting episode was witnessed by her coworkers. They denies any shaking during the episodes. She reports new rib soreness upon waking this morning. She describes it as "being punched or kicked in the ribs". The pain is constant and increases with deep breaths. No new rashes or lesions over the ribs. No recent trauma to the ribcage. Denies fever,   Family history positive for a father with an MI at age 12 and strokes in both paternal grandparents. No recent viral illnesses. She is currently taking oral birth control pills. No cigarette use, alcohol use or IVDU reported. No new changes to diet or medications. Reports adequate water intake. 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 Denies new stressors in her life.    Inpatient Medications  Family History History reviewed. No pertinent family history.   Social History History   Social History  . Marital Status: Single    Spouse Name: N/A  . Number of Children:  N/A  . Years of Education: N/A   Occupational History  . Not on file.   Social History Main Topics  . Smoking status: Never Smoker   . Smokeless tobacco: Never Used  . Alcohol Use: No  . Drug Use: No  . Sexual Activity: Yes    Birth Control/ Protection: Implant   Other Topics Concern  . Not on file   Social History Narrative    Review of Systems  General:  No chills, fever, night sweats or weight changes.  Cardiovascular:  No chest pain, dyspnea on exertion, edema, orthopnea, palpitations, paroxysmal nocturnal dyspnea. Dermatological: No rash, lesions/masses Respiratory: No cough, dyspnea Urologic: No hematuria, dysuria Abdominal:   No nausea, vomiting, diarrhea, bright red blood per rectum, melena, or hematemesis Neurologic:  No visual changes, wkns, changes in mental status. All other systems reviewed and are otherwise negative except as noted above.  Physical Exam  Blood pressure 110/71, pulse 65, temperature 98.8 F (37.1 C), temperature source Oral, resp. rate 16, last menstrual period 11/11/2014, SpO2 100 %.  General: Pleasant, NAD Psych: Normal affect. Neuro: Alert and oriented X 3. Moves all extremities spontaneously. HEENT: Normal  Neck: Supple without bruits or JVD. Lungs:  Resp regular and unlabored, CTA. Heart: RRR no s3, s4, or murmurs. Abdomen: Soft, non-tender, non-distended, BS + x 4.  Extremities: No clubbing, cyanosis or edema. DP/PT/Radials 2+ and equal bilaterally.  Labs  No results for input(s): CKTOTAL, CKMB, TROPONINI in the last 72 hours. Lab Results  Component Value Date   WBC 3.4* 12/04/2014   HGB 13.0 12/04/2014   HCT 38.2 12/04/2014   MCV 86.0 12/04/2014   PLT 237 12/04/2014    Recent Labs Lab 12/04/14 1431  NA 137  K 3.5  CL 105  CO2 24  BUN 7  CREATININE 0.76  CALCIUM 9.5  GLUCOSE 101*   No results found for: CHOL, HDL, LDLCALC, TRIG Lab Results  Component Value Date   DDIMER <0.27 12/04/2014    Radiology/Studies  Dg Chest 2 View  12/04/2014   CLINICAL DATA:  Cardiac palpitations with shortness of breath and dizziness for 1 week  EXAM: CHEST  2 VIEW  COMPARISON:  None.  FINDINGS: Lungs are clear. Heart size and pulmonary vascularity are normal. No adenopathy. No bone lesions.  IMPRESSION: No abnormality noted.   Electronically Signed   By: Bretta Bang III M.D.   On: 12/04/2014 14:42   Echocardiogram - none  ECG: ST, RAD, RAE, non-specific T wave abnormalities    ASSESSMENT AND PLAN  24 year old female   1. Palpitations - most probably SVT - her ECG is abnormal and suggestive of right atrial and possibly RV enlargement. There is no murmur on physical exam suggestive of ASD. She had negative D Dimer. She is not SOB in between the episodes.  Telemetry in the ER is normal, however she didn't have an episode here.  She was offered an admission since she has episodes daily and for an echocardiogram to evaluate her anatomy. She prefers to go home.  She is advised not to drive until we finish work up, avoid work this weekend (works outside in the heat)< stay hydrated, replacing electrolytes and use Valsalva maneuvers with episodes of SVTs.  She is advised to come back to the ER if she has another syncopal episode. We will arrange for TTE and Holter in our clinic.    Signed, Lars Masson, MD, [facility] 12/04/2014, 6:37 PM

## 2014-12-04 NOTE — ED Notes (Signed)
Notified Stevie, PA-C, patient reporting severe headache prior to discharge. She is in to see the patient at this time.

## 2014-12-04 NOTE — ED Notes (Signed)
Pt sts palpitations starting yesterday; pt sts some bilateral rib pain today; pt sts LMP was 11/11/14 and was abnormal in length

## 2014-12-04 NOTE — ED Provider Notes (Signed)
CSN: [id number]     Arrival date & time 12/04/14  1411 History   First MD Initiated Contact with Patient 12/04/14 1532     Chief Complaint  Patient presents with  . Palpitations  . rib pain     HPI  Ms. Katelyn Fuentes is a 24 year old female presenting with palpitations and rib pain. She describes daily episodes of a feeling that "her heart alternates between fast and slow". The episodes began 1 week ago and would last for a few minutes. Associated symptoms include "tunnel vision", cold sweats, chills and generalized weakness. After the episodes, there is chest tightness and headaches that lasts for about an hour. She reports fainting with two of the episode. She states that her vision gets blurry and she is too weak to hold herself up so she falls to the ground. She is uncertain if she loses consciousness or not. One fainting episode was witnessed by her coworkers. They deny any shaking or seizure-like activity during the episodes. Between episodes, she says she feels "totally back to normal".  She reports new rib soreness upon waking this morning. She describes it as "being punched or kicked in the ribs". The pain is constant, mild and increases with deep breaths. No new rashes or lesions over the ribs. No recent trauma to the ribcage. Denies fever, SOB, chest pain, vomiting, abdominal pain, diarrhea or muscle aches. She says she gets nauseous sometimes.   Family history positive for a father with an MI at age 42 and strokes in both paternal grandparents. No recent viral illnesses. She is currently taking oral birth control pills. No cigarette use, alcohol use or IVDU reported. No new changes to diet or medications. Reports adequate water intake. 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 Denies new stressors in her life.   Past Medical History  Diagnosis Date  . Asthma     childhood   Past Surgical History  Procedure Laterality Date  . No past surgeries     History reviewed. No  pertinent family history. History  Substance Use Topics  . Smoking status: Never Smoker   . Smokeless tobacco: Never Used  . Alcohol Use: No   OB History    Gravida Para Term Preterm AB TAB SAB Ectopic Multiple Living   1 1 1       1      Review of Systems  Constitutional: Negative for fever.  Respiratory: Positive for chest tightness. Negative for cough and shortness of breath.   Cardiovascular: Positive for chest pain and palpitations.  Gastrointestinal: Positive for nausea. Negative for vomiting, abdominal pain, diarrhea and constipation.  Genitourinary: Negative for dysuria.  Musculoskeletal: Negative for myalgias.       Positive for bilateral rib pain  Skin: Negative for rash and wound.  Neurological: Positive for syncope, weakness, light-headedness and headaches. Negative for seizures.  Psychiatric/Behavioral: The patient is not nervous/anxious.       Allergies  Peach seed  Home Medications   Prior to Admission medications   Medication Sig Start Date End Date Taking? Authorizing Provider  cephALEXin (KEFLEX) 500 MG capsule Take 1 capsule (500 mg total) by mouth 4 (four) times daily. 09/24/12   Kathrynn Speed, PA-C  ferrous sulfate 325 (65 FE) MG tablet Take 325 mg by mouth daily with breakfast.    Historical Provider, MD  naproxen sodium (ANAPROX) 220 MG tablet Take 220 mg by mouth as needed. Pain    Historical Provider, MD  prenatal  vitamin w/FE, FA (PRENATAL 1 + 1) 27-1 MG TABS Take 1 tablet by mouth daily at 12 noon.    Historical Provider, MD   BP 118/77 mmHg  Pulse 74  Temp(Src) 98.3 F (36.8 C) (Oral)  Resp 15  SpO2 100%  LMP 11/11/2014 Physical Exam  Constitutional: She is oriented to person, place, and time. She appears well-developed and well-nourished. No distress.  Cardiovascular: Normal rate, regular rhythm and normal heart sounds.   No murmur heard. Pulmonary/Chest: Effort normal and breath sounds normal. She exhibits tenderness (Inferior rib and  lateral chest wall tenderness).  Abdominal: Soft. She exhibits no distension. There is no tenderness.  Neurological: She is alert and oriented to person, place, and time.  Skin: Skin is warm. No rash noted.  Psychiatric: She has a normal mood and affect.    ED Course  Procedures (including critical care time) Labs Review Labs Reviewed  BASIC METABOLIC PANEL - Abnormal; Notable for the following:    Glucose, Bld 101 (*)    All other components within normal limits  CBC - Abnormal; Notable for the following:    WBC 3.4 (*)    All other components within normal limits  D-DIMER, QUANTITATIVE (NOT AT [facility])  I-STAT BETA HCG BLOOD, ED ([facility], [facility], [facility] ONLY)  I-STAT TROPOININ, ED    Imaging Review Dg Chest 2 View  12/04/2014   CLINICAL DATA:  Cardiac palpitations with shortness of breath and dizziness for 1 week  EXAM: CHEST  2 VIEW  COMPARISON:  None.  FINDINGS: Lungs are clear. Heart size and pulmonary vascularity are normal. No adenopathy. No bone lesions.  IMPRESSION: No abnormality noted.   Electronically Signed   By: Bretta Bang III M.D.   On: 12/04/2014 14:42     EKG Interpretation   Date/Time:  Friday December 04 2014 14:23:58 EDT Ventricular Rate:  103 PR Interval:  160 QRS Duration: 72 QT Interval:  350 QTC Calculation: 458 R Axis:   96 Text Interpretation:  Sinus tachycardia Right atrial enlargement Rightward  axis Nonspecific T wave abnormality Abnormal ECG No previous ECGs  available Confirmed by RANCOUR  MD, STEPHEN ([id number]) on 12/04/2014 6:06:45 PM     7:45 PM: Pt reports a headache before discharge. States that it is her typical headache she gets when she hasn't eaten in a long time and is due to not eating while in the ED. Denies current lightheadedness, palpitations, SOB, chills, nausea, tunnel vision or symptoms of her palpitations episodes. I reiterated with her the plan to come back to the ED if she develops any of these symptoms after discharge. Given 325 mg tylenol  before discharge for headache.   MDM   Final diagnoses:  Palpitations    1. Heart Palpitations - EKG, CXR, CBC, BMP, HCG, troponin and d dimer - CXR, CBC, BMP, troponin, d dimer unremarkable - EKG shows nonspecific T wave abnormalities, repeat EKG ordered - Cardiology consult ordered - Cardiologist spoke with Ms. Katelyn Fuentes and wanted to admit her for observation. Ms. Katelyn Fuentes declined. Spoke with her about the risks of leaving the hospital against medical advice, she stated she understood. - Follow up with [facility] on Monday 8/8 for holter monitor and echo scheduled  - Instructed Ms. Howell to return to the ED with any palpitations, lightheadedness, chest pain, syncope, or worsening symptoms - Avoid driving and stay out of work until work up by cardiology - Work note given  2. Rib pain - CXR shows no abnormalities  Adrena Nakamura,  PA-C 12/04/14 2223  Glynn Octave, MD 12/05/14 [phone]

## 2014-12-08 ENCOUNTER — Telehealth: Payer: Self-pay | Admitting: Cardiology

## 2014-12-08 DIAGNOSIS — I471 Supraventricular tachycardia, unspecified: Secondary | ICD-10-CM | POA: Insufficient documentation

## 2014-12-08 NOTE — Telephone Encounter (Signed)
New Message        Pt calling due to being D/c from ER and told she needed a holter monitor and Echo, Dr. Delton See did consult on pt while in the hospital. No orders in Epic for either, Please call back and advise pt.

## 2014-12-08 NOTE — Telephone Encounter (Signed)
According to Dr Lindaann Slough consultation note from seeing the pt in the ER on 8/5, she advised the pt to stay in the hospital for further cardiac work-up, for episodes of SVT.   Pt refused to stay in the hospital, so Dr Delton See advised her to have an echo and holter monitor done as outpatient in our clinic, for episodes of SVT.  Informed the pt that given Dr Lindaann Slough thorough dictation note about pt needing an echo and holter monitor done on a outpt basis, I will place these orders in the system and send our [facility] schedulers a message to contact the pt to schedule this tests.  Pt verbalized understanding, agrees with this plan, and gracious for all the assistance provided.

## 2014-12-08 NOTE — Telephone Encounter (Signed)
Pt scheduled for echo and 48 hour holter monitor for tomorrow 12/09/14 and aware of these appts.

## 2014-12-09 ENCOUNTER — Telehealth: Payer: Self-pay | Admitting: *Deleted

## 2014-12-09 ENCOUNTER — Ambulatory Visit (INDEPENDENT_AMBULATORY_CARE_PROVIDER_SITE_OTHER): Payer: Medicaid Other

## 2014-12-09 ENCOUNTER — Other Ambulatory Visit: Payer: Self-pay

## 2014-12-09 ENCOUNTER — Ambulatory Visit (HOSPITAL_COMMUNITY): Payer: Medicaid Other | Attending: Cardiology

## 2014-12-09 DIAGNOSIS — I471 Supraventricular tachycardia: Secondary | ICD-10-CM | POA: Diagnosis not present

## 2014-12-09 DIAGNOSIS — Z8249 Family history of ischemic heart disease and other diseases of the circulatory system: Secondary | ICD-10-CM | POA: Insufficient documentation

## 2014-12-09 DIAGNOSIS — I341 Nonrheumatic mitral (valve) prolapse: Secondary | ICD-10-CM

## 2014-12-09 NOTE — Telephone Encounter (Signed)
-----   Message from Lars Masson, MD sent at 12/09/2014 12:21 PM EDT ----- She has normal left ventricular function and overall normal echocardiogram other than mild anterior mitral valve leaflet prolapse. Its not causing her any regurgitation and we don't need to treat it, we will follow in the future - echo in 5 years.

## 2014-12-09 NOTE — Telephone Encounter (Signed)
Notified the pt to inform her that per Dr Delton See she has normal left ventricular and overall normal echo other than mild anterior mitral valve leaflet prolapse.  Informed the pt that per Dr Delton See, this is not causing her any regurgitation and we don't need to treat it, we will just follow up with a repeat echo in 5 years.  Provided pt education on this diagnosis. Informed the pt that I will place this order in the system, and send our schedulers a message to recall this echo for 5 years out.  Pt verbalized understanding and agrees with this plan.

## 2015-03-01 ENCOUNTER — Ambulatory Visit: Payer: Medicaid Other | Admitting: Cardiology

## 2015-03-09 ENCOUNTER — Encounter: Payer: Self-pay | Admitting: Cardiology

## 2015-11-05 ENCOUNTER — Ambulatory Visit (HOSPITAL_COMMUNITY)
Admission: EM | Admit: 2015-11-05 | Discharge: 2015-11-05 | Disposition: A | Discharge status: Home / Self Care | Payer: Medicaid Other | Attending: Family Medicine | Admitting: Family Medicine

## 2015-11-05 ENCOUNTER — Encounter (HOSPITAL_COMMUNITY): Payer: Self-pay | Admitting: Emergency Medicine

## 2015-11-05 DIAGNOSIS — L509 Urticaria, unspecified: Secondary | ICD-10-CM

## 2015-11-05 MED ORDER — DIPHENHYDRAMINE HCL 25 MG PO CAPS
50.0000 mg | ORAL_CAPSULE | Freq: Once | ORAL | Status: AC
  Administered 2015-11-05: 50 mg via ORAL

## 2015-11-05 MED ORDER — PREDNISONE 10 MG (21) PO TBPK: 10.0000 mg | ORAL_TABLET | Freq: Every day | ORAL | Status: DC

## 2015-11-05 MED ORDER — DEXAMETHASONE 4 MG PO TABS
ORAL_TABLET | ORAL | Status: AC
  Filled 2015-11-05: qty 2

## 2015-11-05 MED ORDER — DEXAMETHASONE 2 MG PO TABS
ORAL_TABLET | ORAL | Status: AC
  Filled 2015-11-05: qty 1

## 2015-11-05 MED ORDER — DIPHENHYDRAMINE HCL 25 MG PO CAPS
ORAL_CAPSULE | ORAL | Status: AC
  Filled 2015-11-05: qty 2

## 2015-11-05 MED ORDER — DEXAMETHASONE 4 MG PO TABS
10.0000 mg | ORAL_TABLET | Freq: Once | ORAL | Status: AC
  Administered 2015-11-05: 10 mg via ORAL

## 2015-11-05 NOTE — ED Provider Notes (Signed)
CSN: [id number]     Arrival date & time 11/05/15  1821 History   First MD Initiated Contact with Patient 11/05/15 1902     Chief Complaint  Patient presents with  . Rash   (Consider location/radiation/quality/duration/timing/severity/associated sxs/prior Treatment) HPI  July 4 patient developed Hg hives. Started on back migrated to arms neck head and legs over the course of the next 2 days. Worse at night. No more relief with calamine lotion. No relief with Zantac or Claritin. Denies any new soaps lotions or detergents or foods. 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 This is never happened the patient before. Denies any difficulty with breathing or tongue swelling. Some improvement in overall severity of symptoms. Denies any chest pain, fevers, difficulty swallowing or breathing, chest pain, shortness breath, palpitations, LOC, neck stiffness, headache.    Past Medical History  Diagnosis Date  . Asthma     childhood   Past Surgical History  Procedure Laterality Date  . No past surgeries     Family History  Problem Relation Age of Onset  . Diabetes Neg Hx   . Heart failure Neg Hx   . Hyperlipidemia Neg Hx   . Hypertension Neg Hx    Social History  Substance Use Topics  . Smoking status: Never Smoker   . Smokeless tobacco: Never Used  . Alcohol Use: No   OB History    Gravida Para Term Preterm AB TAB SAB Ectopic Multiple Living   1 1 1       1      Review of Systems Per HPI with all other pertinent systems negative.   Allergies  Peach seed  Home Medications   Prior to Admission medications   Medication Sig Start Date End Date Taking? Authorizing Provider  cephALEXin (KEFLEX) 500 MG capsule Take 1 capsule (500 mg total) by mouth 4 (four) times daily. 09/24/12   Kathrynn Speedobyn M Hess, PA-C  ferrous sulfate 325 (65 FE) MG tablet Take 325 mg by mouth daily with breakfast.    Historical Provider, MD  naproxen sodium (ANAPROX) 220 MG tablet Take 220 mg by mouth as needed. Pain     Historical Provider, MD  predniSONE (STERAPRED UNI-PAK 21 TAB) 10 MG (21) TBPK tablet Take 1 tablet (10 mg total) by mouth daily. 12 day dose pack 11/05/15   Ozella Rocksavid J Ranyah Groeneveld, MD  prenatal vitamin w/FE, FA (PRENATAL 1 + 1) 27-1 MG TABS Take 1 tablet by mouth daily at 12 noon.    Historical Provider, MD   Meds Ordered and Administered this Visit   Medications  dexamethasone (DECADRON) tablet 10 mg (not administered)  diphenhydrAMINE (BENADRYL) capsule 50 mg (not administered)    BP 118/69 mmHg  Pulse 61  Temp(Src) 98 F (36.7 C) (Oral)  Resp 16  SpO2 100%  LMP 11/05/2015 No data found.   Physical Exam Physical Exam  Constitutional: oriented to person, place, and time. appears well-developed and well-nourished. No distress.  HENT:  Head: Normocephalic and atraumatic.  Eyes: EOMI. PERRL.  Neck: Normal range of motion.  Cardiovascular: RRR, no m/r/g, 2+ distal pulses,  Pulmonary/Chest: Effort normal and breath sounds normal. No respiratory distress.  Abdominal: Soft. Bowel sounds are normal. NonTTP, no distension.  Musculoskeletal: Normal range of motion. Non ttp, no effusion.  Neurological: alert and oriented to person, place, and time.  Skin: Faint macular papular rash across back and trunk.  Psychiatric: normal mood and affect. behavior is normal. Judgment and thought content normal.   ED Course  Procedures (including critical care time)  Labs Review Labs Reviewed - No data to display  Imaging Review No results found.   Visual Acuity Review  Right Eye Distance:   Left Eye Distance:   Bilateral Distance:    Right Eye Near:   Left Eye Near:    Bilateral Near:         MDM   1. 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 Decadron 10 mg by mouth and Benadryl 50 mg by mouth given in clinic. Patient start 12 day if symptoms persist    Ozella Rocksavid J Archana Eckman,  MD 11/05/15 1929

## 2015-11-05 NOTE — ED Notes (Signed)
Pt c/o intermittent rash all over body onset 7/4 Denies fevers, chills A&O x4... NAD

## 2015-11-05 NOTE — Discharge Instructions (Signed)
The cause of your rashes not immediately clear but is likely related to an allergic response to something he came in contact with either at work, or any environment. There is a chance that this is something called a transient urticarial rash which typically comes from a heightened immune system and does not have a particular trigger. Your given a dose of steroid to begin reversing allergic response. If your symptoms continue please take your prednisone daily as prescribed. May use Benadryl for additional itch relief. Please also take a daily allergy pill for the next 2 weeks. You are safe to return to work.

## 2015-11-12 ENCOUNTER — Encounter (HOSPITAL_COMMUNITY): Payer: Self-pay | Admitting: Emergency Medicine

## 2015-11-12 ENCOUNTER — Emergency Department (HOSPITAL_COMMUNITY)
Admission: EM | Admit: 2015-11-12 | Discharge: 2015-11-12 | Disposition: A | Discharge status: Home / Self Care | Payer: Medicaid Other | Attending: Emergency Medicine | Admitting: Emergency Medicine

## 2015-11-12 DIAGNOSIS — Z79899 Other long term (current) drug therapy: Secondary | ICD-10-CM | POA: Insufficient documentation

## 2015-11-12 DIAGNOSIS — R1013 Epigastric pain: Secondary | ICD-10-CM

## 2015-11-12 DIAGNOSIS — J45909 Unspecified asthma, uncomplicated: Secondary | ICD-10-CM | POA: Insufficient documentation

## 2015-11-12 DIAGNOSIS — G43109 Migraine with aura, not intractable, without status migrainosus: Secondary | ICD-10-CM

## 2015-11-12 LAB — COMPREHENSIVE METABOLIC PANEL
ALBUMIN: 3.7 g/dL (ref 3.5–5.0)
ALT: 15 U/L (ref 14–54)
ANION GAP: 9 (ref 5–15)
AST: 20 U/L (ref 15–41)
Alkaline Phosphatase: 36 U/L — ABNORMAL LOW (ref 38–126)
BILIRUBIN TOTAL: 0.4 mg/dL (ref 0.3–1.2)
BUN: 6 mg/dL (ref 6–20)
CO2: 26 mmol/L (ref 22–32)
Calcium: 9.2 mg/dL (ref 8.9–10.3)
Chloride: 102 mmol/L (ref 101–111)
Creatinine, Ser: 0.73 mg/dL (ref 0.44–1.00)
GFR calc Af Amer: >60 mL/min (ref >60)
GFR calc non Af Amer: >60 mL/min (ref >60)
Glucose, Bld: 105 mg/dL — ABNORMAL HIGH (ref 65–99)
Potassium: 3.6 mmol/L (ref 3.5–5.1)
Sodium: 137 mmol/L (ref 135–145)
Total Protein: 6.7 g/dL (ref 6.5–8.1)

## 2015-11-12 LAB — CBC WITH DIFFERENTIAL/PLATELET
BASOS ABS: 0 10*3/uL (ref 0.0–0.1)
BASOS PCT: 0 %
EOS PCT: 2 %
Eosinophils Absolute: 0.1 10*3/uL (ref 0.0–0.7)
HEMATOCRIT: 37 % (ref 36.0–46.0)
HEMOGLOBIN: 12.3 g/dL (ref 12.0–15.0)
LYMPHS PCT: 57 %
Lymphs Abs: 1.3 10*3/uL (ref 0.7–4.0)
MCH: 28.7 pg (ref 26.0–34.0)
MCHC: 33.2 g/dL (ref 30.0–36.0)
MCV: 86.2 fL (ref 78.0–100.0)
MONOS PCT: 10 %
Monocytes Absolute: 0.3 10*3/uL (ref 0.1–1.0)
NEUTROS ABS: 0.8 10*3/uL — AB (ref 1.7–7.7)
Neutrophils Relative %: 31 %
Platelets: 265 10*3/uL (ref 150–400)
RBC: 4.29 MIL/uL (ref 3.87–5.11)
RDW: 13 % (ref 11.5–15.5)
WBC: 2.5 10*3/uL — ABNORMAL LOW (ref 4.0–10.5)

## 2015-11-12 LAB — LIPASE, BLOOD: Lipase: 21 U/L (ref 11–51)

## 2015-11-12 MED ORDER — NAPROXEN 375 MG PO TABS: 375.0000 mg | ORAL_TABLET | Freq: Two times a day (BID) | ORAL | Status: DC

## 2015-11-12 MED ORDER — KETOROLAC TROMETHAMINE 15 MG/ML IJ SOLN
15.0000 mg | Freq: Once | INTRAMUSCULAR | Status: AC
  Administered 2015-11-12: 15 mg via INTRAVENOUS
  Filled 2015-11-12: qty 1

## 2015-11-12 MED ORDER — SODIUM CHLORIDE 0.9 % IV BOLUS (SEPSIS)
1000.0000 mL | Freq: Once | INTRAVENOUS | Status: AC
  Administered 2015-11-12: 1000 mL via INTRAVENOUS

## 2015-11-12 MED ORDER — METOCLOPRAMIDE HCL 5 MG/ML IJ SOLN
10.0000 mg | Freq: Once | INTRAMUSCULAR | Status: AC
  Administered 2015-11-12: 10 mg via INTRAVENOUS
  Filled 2015-11-12: qty 2

## 2015-11-12 NOTE — Discharge Instructions (Signed)
We saw you in the ER for headaches. All the labs and imaging are normal. We are not sure what is causing your headaches, however, there appears to be no evidence of infection, bleeds or tumors based on our exam and results.  Please take motrin round the clock for the next 6 hours, and take other meds prescribed only for break through pain. See your doctor if the pain persists, as you might need better medications or a specialist.  Please return to the ER if the headache gets severe and in not improving, you have associated new one sided numbness, tingling, weakness or confusion, seizures, poor balance or poor vision.       Migraine Headache A migraine headache is an intense, throbbing pain on one or both sides of your head. A migraine can last for 30 minutes to several hours. CAUSES  The exact cause of a migraine headache is not always known. However, a migraine may be caused when nerves in the brain become irritated and release chemicals that cause inflammation. This causes pain. Certain things may also trigger migraines, such as:  Alcohol.  Smoking.  Stress.  Menstruation.  Aged cheeses.  Foods or drinks that contain nitrates, glutamate, aspartame, or tyramine.  Lack of sleep.  Chocolate.  Caffeine.  Hunger.  Physical exertion.  Fatigue.  Medicines used to treat chest pain (nitroglycerine), birth control pills, estrogen, and some blood pressure medicines. SIGNS AND SYMPTOMS  Pain on one or both sides of your head.  Pulsating or throbbing pain.  Severe pain that prevents daily activities.  Pain that is aggravated by any physical activity.  Nausea, vomiting, or both.  Dizziness.  Pain with exposure to bright lights, loud noises, or activity.  General sensitivity to bright lights, loud noises, or smells. Before you get a migraine, you may get warning signs that a migraine is coming (aura). An aura may include:  Seeing flashing lights.  Seeing bright  spots, halos, or zigzag lines.  Having tunnel vision or blurred vision.  Having feelings of numbness or tingling.  Having trouble talking.  Having muscle weakness. DIAGNOSIS  A migraine headache is often diagnosed based on:  Symptoms.  Physical exam.  A CT scan or MRI of your head. These imaging tests cannot diagnose migraines, but they can help rule out other causes of headaches. TREATMENT Medicines may be given for pain and nausea. Medicines can also be given to help prevent recurrent migraines.  HOME CARE INSTRUCTIONS  Only take over-the-counter or prescription medicines for pain or discomfort as directed by your health care provider. The use of long-term narcotics is not recommended.  Lie down in a dark, quiet room when you have a migraine.  Keep a journal to find out what may trigger your migraine headaches. For example, write down:  What you eat and drink.  How much sleep you get.  Any change to your diet or medicines.  Limit alcohol consumption.  Quit smoking if you smoke.  Get 7-9 hours of sleep, or as recommended by your health care provider.  Limit stress.  Keep lights dim if bright lights bother you and make your migraines worse. SEEK IMMEDIATE MEDICAL CARE IF:   Your migraine becomes severe.  You have a fever.  You have a stiff neck.  You have vision loss.  You have muscular weakness or loss of muscle control.  You start losing your balance or have trouble walking.  You feel faint or pass out.  You have severe symptoms that  are different from your first symptoms. MAKE SURE YOU:   Understand these instructions.  Will watch your condition.  Will get help right away if you are not doing well or get worse.   This information is not intended to replace advice given to you by your health care provider. Make sure you discuss any questions you have with your health care provider.   Document Released: 04/17/2005 Document Revised: 05/08/2014  Document Reviewed: 12/23/2012 Elsevier Interactive Patient Education [employer]

## 2015-11-12 NOTE — ED Notes (Signed)
Patient complains of increasingly severe migraines starting 2-3 weeks ago.  States migraines are accompanied by nausea and vomiting.  Has been seen by primary doctor in the past and that doctor suspected migraines were caused by anxiety, patient states medication prescribed at that time did not help.  Patient alert and oriented at this time.

## 2015-11-12 NOTE — ED Notes (Signed)
Patient reports improved nausea

## 2015-11-12 NOTE — ED Provider Notes (Signed)
CSN: [id number]     Arrival date & time 11/12/15  4098 History   First MD Initiated Contact with Patient 11/12/15 [phone]     Chief Complaint  Patient presents with  . Migraine     (Consider location/radiation/quality/duration/timing/severity/associated sxs/prior Treatment) HPI Comments: Pt comes in with cc of migraines. Pt has no medical hx and has not been formally diagnosed with migraines. Pt reports that she has had headaches since 9th grade. The headaches start behind her ears and radiate towards her eyes on the L side. Pt has associated nausea and some blurry vision on the L side and non specific tingling in both of her arms. The throbbing headaches typically present every week or 2, but over the last month they are presenting every 3-4 days. Pain is worse with lights, sounds. Pain is not positional. Pt also reports 2-3 loose BM, they typically show up with her headaches. She reports having some chills yday with sweating, and her temp at home was 102. She has no fevers, but did take some meds prior to ER arrival. Pt has mild epigastric pain. LMP was yday. Pt denies chest pains, shortness of breath, cough, abdominal pain, uti like symptoms.   ROS 10 Systems reviewed and are negative for acute change except as noted in the HPI.      Patient is a 25 y.o. female presenting with migraines. The history is provided by the patient.  Migraine    Past Medical History  Diagnosis Date  . Asthma     childhood   Past Surgical History  Procedure Laterality Date  . No past surgeries     Family History  Problem Relation Age of Onset  . Diabetes Neg Hx   . Heart failure Neg Hx   . Hyperlipidemia Neg Hx   . Hypertension Neg Hx    Social History  Substance Use Topics  . Smoking status: Never Smoker   . Smokeless tobacco: Never Used  . Alcohol Use: Yes     Comment: occasional   OB History    Gravida Para Term Preterm AB TAB SAB Ectopic Multiple Living   Review  of Systems    Allergies  Peach seed  Home Medications   Prior to Admission medications   Medication Sig Start Date End Date Taking? Authorizing Provider  cephALEXin (KEFLEX) 500 MG capsule Take 1 capsule (500 mg total) by mouth 4 (four) times daily. 09/24/12   Kathrynn Speed, PA-C  ferrous sulfate 325 (65 FE) MG tablet Take 325 mg by mouth daily with breakfast.    Historical Provider, MD  naproxen sodium (ANAPROX) 220 MG tablet Take 220 mg by mouth as needed. Pain    Historical Provider, MD  predniSONE (STERAPRED UNI-PAK 21 TAB) 10 MG (21) TBPK tablet Take 1 tablet (10 mg total) by mouth daily. 12 day dose pack 11/05/15   Ozella Rocks, MD  prenatal vitamin w/FE, FA (PRENATAL 1 + 1) 27-1 MG TABS Take 1 tablet by mouth daily at 12 noon.    Historical Provider, MD   BP 114/79 mmHg  Pulse 92  Temp(Src) 98.7 F (37.1 C) (Oral)  Resp 14  SpO2 100%  LMP 11/05/2015 Physical Exam  Constitutional: She is oriented to person, place, and time. She appears well-developed.  HENT:  Head: Normocephalic and atraumatic.  Eyes: Conjunctivae and EOM are normal. Pupils are equal, round, and reactive to light.  Neck: Normal  range of motion. Neck supple.  Cardiovascular: Normal rate, regular rhythm and normal heart sounds.   Pulmonary/Chest: Effort normal and breath sounds normal. No respiratory distress.  Abdominal: Soft. Bowel sounds are normal. She exhibits no distension. There is tenderness. There is no rebound and no guarding.  Neurological: She is alert and oriented to person, place, and time. No cranial nerve deficit. Coordination normal.  Cerebellar exam is normal (finger to nose) Sensory exam normal for bilateral upper and lower extremities - and patient is able to discriminate between sharp and dull. Motor exam is 4+/5   Skin: Skin is warm and dry.  Nursing note and vitals reviewed.   ED Course  Procedures (including critical care time) Labs Review Labs Reviewed  CBC WITH  DIFFERENTIAL/PLATELET  COMPREHENSIVE METABOLIC PANEL  LIPASE, BLOOD    Imaging Review No results found. I have personally reviewed and evaluated these images and lab results as part of my medical decision-making.   EKG Interpretation None      MDM   Final diagnoses:  Migraine equivalent  Epigastric pain   Pt comes in with cc of epigastric pain and headaches. Pt has migraine type headaches, which are getting worse. She is on birth control pills for a long duration, but explicitly states that the current headaches started even before she started bith control pills as a teenager - and the headaches, except for frequency, are the same. We think thrombosis is lower in our differential diagnosis. Will give some meds for migraine type headaches and have pt see a Neurolgist for optimal management and possibly additional workup.  Loose BM - not concerning.  Fevers - no source of infection. No meningismus. Will get labs due to abd discomfort in the epigastrium.    Derwood KaplanAnkit Lynel Forester, MD 11/12/15 [phone]

## 2015-11-14 LAB — PATHOLOGIST SMEAR REVIEW

## 2016-03-28 ENCOUNTER — Ambulatory Visit (INDEPENDENT_AMBULATORY_CARE_PROVIDER_SITE_OTHER): Payer: Medicaid Other | Admitting: *Deleted

## 2016-03-28 DIAGNOSIS — Z3201 Encounter for pregnancy test, result positive: Secondary | ICD-10-CM

## 2016-03-28 DIAGNOSIS — Z3200 Encounter for pregnancy test, result unknown: Secondary | ICD-10-CM

## 2016-03-28 DIAGNOSIS — Z3490 Encounter for supervision of normal pregnancy, unspecified, unspecified trimester: Secondary | ICD-10-CM

## 2016-03-28 LAB — POCT PREGNANCY, URINE: Preg Test, Ur: POSITIVE — AB

## 2016-03-28 NOTE — Progress Notes (Signed)
Here for pregnancy test which was positive . LMP 01/18/16 which makes her 10 wk. Would like to get prenatal care here. C/o dizziness, passed out once, cramps. Denies any bleeding or severe pain. will make new ob asap. Instructed to go to mau if passes out again, severe pain, bleeding.etc. States not taking any prescription meds or otc meds right now except prenatal vitamins. Discussed not taking any meds without checking with provider first. She voices understanding.

## 2016-04-06 ENCOUNTER — Inpatient Hospital Stay (HOSPITAL_COMMUNITY)
Admission: AD | Admit: 2016-04-06 | Discharge: 2016-04-07 | Disposition: A | Discharge status: Home / Self Care | Payer: Medicaid Other | Source: Ambulatory Visit | Attending: Obstetrics and Gynecology | Admitting: Obstetrics and Gynecology

## 2016-04-06 DIAGNOSIS — N949 Unspecified condition associated with female genital organs and menstrual cycle: Secondary | ICD-10-CM

## 2016-04-06 DIAGNOSIS — R42 Dizziness and giddiness: Secondary | ICD-10-CM | POA: Insufficient documentation

## 2016-04-06 DIAGNOSIS — R102 Pelvic and perineal pain: Secondary | ICD-10-CM | POA: Insufficient documentation

## 2016-04-06 DIAGNOSIS — Z3A12 12 weeks gestation of pregnancy: Secondary | ICD-10-CM | POA: Insufficient documentation

## 2016-04-06 DIAGNOSIS — O26891 Other specified pregnancy related conditions, first trimester: Secondary | ICD-10-CM | POA: Insufficient documentation

## 2016-04-07 ENCOUNTER — Encounter (HOSPITAL_COMMUNITY): Payer: Self-pay | Admitting: *Deleted

## 2016-04-07 ENCOUNTER — Inpatient Hospital Stay (HOSPITAL_COMMUNITY): Payer: Medicaid Other

## 2016-04-07 DIAGNOSIS — Z3A12 12 weeks gestation of pregnancy: Secondary | ICD-10-CM

## 2016-04-07 DIAGNOSIS — R102 Pelvic and perineal pain: Secondary | ICD-10-CM | POA: Diagnosis present

## 2016-04-07 DIAGNOSIS — O26891 Other specified pregnancy related conditions, first trimester: Secondary | ICD-10-CM | POA: Diagnosis not present

## 2016-04-07 DIAGNOSIS — R42 Dizziness and giddiness: Secondary | ICD-10-CM | POA: Diagnosis not present

## 2016-04-07 DIAGNOSIS — O9989 Other specified diseases and conditions complicating pregnancy, childbirth and the puerperium: Secondary | ICD-10-CM

## 2016-04-07 DIAGNOSIS — N949 Unspecified condition associated with female genital organs and menstrual cycle: Secondary | ICD-10-CM | POA: Diagnosis not present

## 2016-04-07 LAB — HIV ANTIBODY (ROUTINE TESTING W REFLEX): HIV SCREEN 4TH GENERATION: NONREACTIVE

## 2016-04-07 LAB — CBC
HCT: 30.8 % — ABNORMAL LOW (ref 36.0–46.0)
Hemoglobin: 11 g/dL — ABNORMAL LOW (ref 12.0–15.0)
MCH: 29.4 pg (ref 26.0–34.0)
MCHC: 35.7 g/dL (ref 30.0–36.0)
MCV: 82.4 fL (ref 78.0–100.0)
PLATELETS: 283 10*3/uL (ref 150–400)
RBC: 3.74 MIL/uL — ABNORMAL LOW (ref 3.87–5.11)
RDW: 13.4 % (ref 11.5–15.5)
WBC: 5.8 10*3/uL (ref 4.0–10.5)

## 2016-04-07 LAB — RPR: RPR: NONREACTIVE

## 2016-04-07 LAB — HCG, QUANTITATIVE, PREGNANCY: HCG, BETA CHAIN, QUANT, S: 89203 m[IU]/mL — AB (ref <5)

## 2016-04-07 NOTE — MAU Note (Signed)
Pt reports she had an episode of severe cramping tonight and then fainted, this happened at about 2300,. Positive pregnancy test in clinic downstairs.

## 2016-04-07 NOTE — MAU Note (Signed)
Not in lobby

## 2016-04-07 NOTE — MAU Provider Note (Signed)
History     CSN: [id number]  Arrival date and time: 04/06/16 2347   None     Chief Complaint  Patient presents with  . Pelvic Pain   Pelvic Pain  The patient's primary symptoms include pelvic pain. This is a new problem. The current episode started 1 to 4 weeks ago. The problem occurs intermittently. The problem has been unchanged. Pain severity now: 4/10  The problem affects both sides. She is pregnant. Associated symptoms include abdominal pain, nausea and vomiting ("only once this pregnancy"). Pertinent negatives include no chills or fever. The vaginal discharge was normal. There has been no bleeding. Nothing aggravates the symptoms. She has tried nothing for the symptoms. Menstrual history: LMP 01/18/16      Past Medical History:  Diagnosis Date  . Asthma    childhood    Past Surgical History:  Procedure Laterality Date  . NO PAST SURGERIES      Family History  Problem Relation Age of Onset  . Diabetes Neg Hx   . Heart failure Neg Hx   . Hyperlipidemia Neg Hx   . Hypertension Neg Hx     Social History  Substance Use Topics  . Smoking status: Never Smoker  . Smokeless tobacco: Never Used  . Alcohol use Yes     Comment: occasional    Allergies:  Allergies  Allergen Reactions  . Peach Seed Hives    peaches    No prescriptions prior to admission.    Review of Systems  Constitutional: Negative for chills and fever.  Gastrointestinal: Positive for abdominal pain, nausea and vomiting ("only once this pregnancy").  Genitourinary: Positive for pelvic pain.  Neurological: Positive for dizziness and loss of consciousness ("I have passed out about 4 times" "Last time I passed out was yesterday").   Physical Exam   Blood pressure 103/63, pulse 81, temperature 98.2 F (36.8 C), temperature source Oral, resp. rate 16, height 5\' 6"  (1.676 m), weight 118 lb (53.5 kg), last menstrual period 01/18/2016, SpO2 100 %.  Physical Exam  Nursing note and vitals  reviewed. Constitutional: She is oriented to person, place, and time. She appears well-developed and well-nourished. No distress.  HENT:  Head: Normocephalic.  Cardiovascular: Normal rate.   Respiratory: Effort normal.  GI: Soft. There is no tenderness. There is no rebound.  Neurological: She is alert and oriented to person, place, and time.  Skin: Skin is warm and dry.  Psychiatric: She has a normal mood and affect.    Results for orders placed or performed during the hospital encounter of 04/06/16 (from the past 24 hour(s))  CBC     Status: Abnormal   Collection Time: 04/07/16 12:22 AM  Result Value Ref Range   WBC 5.8 4.0 - 10.5 K/uL   RBC 3.74 (L) 3.87 - 5.11 MIL/uL   Hemoglobin 11.0 (L) 12.0 - 15.0 g/dL   HCT 46.930.8 (L) 62.936.0 - 52.846.0 %   MCV 82.4 78.0 - 100.0 fL   MCH 29.4 26.0 - 34.0 pg   MCHC 35.7 30.0 - 36.0 g/dL   RDW 41.313.4 24.411.5 - 01.015.5 %   Platelets 283 150 - 400 K/uL  hCG, quantitative, pregnancy     Status: Abnormal   Collection Time: 04/07/16 12:22 AM  Result Value Ref Range   hCG, Beta Chain, Quant, S 89,203 (H) <5 mIU/mL   [place] Ob Comp Less 14 Wks  Result Date: 04/07/2016 CLINICAL DATA:  Pregnant patient in first-trimester pregnancy with pelvic pain and cramping. EXAM: OBSTETRIC <  14 WK ULTRASOUND TECHNIQUE: Transabdominal ultrasound was performed for evaluation of the gestation as well as the maternal uterus and adnexal regions. COMPARISON:  None. FINDINGS: Intrauterine gestational sac: Single Yolk sac:  Visualized. Embryo:  Visualized. Cardiac Activity: Visualized. Heart Rate: 150 bpm CRL:   54.9  mm   12 w 0 d                  US EDC: 10/20/2016 Subchorionic hemorrhage:  None visualized. Maternal uterus/adnexae: The left ovary is normal. There is a 4.6 x 2.5 x 3.5 cm simple right adnexal cyst replacing normal ovarian parenchyma. No pelvic free fluid. IMPRESSION: 1. Single live intrauterine pregnancy estimated gestational age [redacted] weeks 0 days for estimated date of delivery  10/20/2016. No complication. 2. Right ovarian cyst measures 4.6 cm. Electronically Signed   By: Rubye OaksMelanie  Ehinger M.D.   On: 04/07/2016 02:39   MAU Course  Procedures  MDM   Assessment and Plan   1. Round ligament pain   2. Pelvic pain affecting pregnancy in first trimester, antepartum   3. [redacted] weeks gestation of pregnancy   4. Dizziness    DC home Comfort measures reviewed  2nd Trimester precautions  RX: none  Return to MAU as needed FU with OB as planned  Follow-up Information    [facility].   Specialty:  Obstetrics and Gynecology Contact information: [address] [place] [id number] [phone]           Tawnya CrookHogan, Heather Donovan 04/07/2016, 5:24 AM

## 2016-04-07 NOTE — Discharge Instructions (Signed)
Second Trimester of Pregnancy The second trimester is from week 13 through week 28, month 4 through 6. This is often the time in pregnancy that you feel your best. Often times, morning sickness has lessened or quit. You may have more energy, and you may get hungry more often. Your unborn baby (fetus) is growing rapidly. At the end of the sixth month, he or she is about 9 inches long and weighs about 1 pounds. You will likely feel the baby move (quickening) between 18 and 20 weeks of pregnancy. Follow these instructions at home:  Avoid all smoking, herbs, and alcohol. Avoid drugs not approved by your doctor.  Do not use any tobacco products, including cigarettes, chewing tobacco, and electronic cigarettes. If you need help quitting, ask your doctor. You may get counseling or other support to help you quit.  Only take medicine as told by your doctor. Some medicines are safe and some are not during pregnancy.  Exercise only as told by your doctor. Stop exercising if you start having cramps.  Eat regular, healthy meals.  Wear a good support bra if your breasts are tender.  Do not use hot tubs, steam rooms, or saunas.  Wear your seat belt when driving.  Avoid raw meat, uncooked cheese, and liter boxes and soil used by cats.  Take your prenatal vitamins.  Take 1500-2000 milligrams of calcium daily starting at the 20th week of pregnancy until you deliver your baby.  Try taking medicine that helps you poop (stool softener) as needed, and if your doctor approves. Eat more fiber by eating fresh fruit, vegetables, and whole grains. Drink enough fluids to keep your pee (urine) clear or pale yellow.  Take warm water baths (sitz baths) to soothe pain or discomfort caused by hemorrhoids. Use hemorrhoid cream if your doctor approves.  If you have puffy, bulging veins (varicose veins), wear support hose. Raise (elevate) your feet for 15 minutes, 3-4 times a day. Limit salt in your diet.  Avoid heavy  lifting, wear low heals, and sit up straight.  Rest with your legs raised if you have leg cramps or low back pain.  Visit your dentist if you have not gone during your pregnancy. Use a soft toothbrush to brush your teeth. Be gentle when you floss.  You can have sex (intercourse) unless your doctor tells you not to.  Go to your doctor visits. Get help if:  You feel dizzy.  You have mild cramps or pressure in your lower belly (abdomen).  You have a nagging pain in your belly area.  You continue to feel sick to your stomach (nauseous), throw up (vomit), or have watery poop (diarrhea).  You have bad smelling fluid coming from your vagina.  You have pain with peeing (urination). Get help right away if:  You have a fever.  You are leaking fluid from your vagina.  You have spotting or bleeding from your vagina.  You have severe belly cramping or pain.  You lose or gain weight rapidly.  You have trouble catching your breath and have chest pain.  You notice sudden or extreme puffiness (swelling) of your face, hands, ankles, feet, or legs.  You have not felt the baby move in over an hour.  You have severe headaches that do not go away with medicine.  You have vision changes. This information is not intended to replace advice given to you by your health care provider. Make sure you discuss any questions you have with your health care   provider. Document Released: 07/12/2009 Document Revised: 09/23/2015 Document Reviewed: 06/18/2012 Elsevier Interactive Patient Education  2017 [employer]  Safe Medications in Pregnancy   Acne: Benzoyl Peroxide Salicylic Acid  Backache/Headache: Tylenol: 2 regular strength every 4 hours OR              2 Extra strength every 6 hours  Colds/Coughs/Allergies: Benadryl (alcohol free) 25 mg every 6 hours as needed Breath right strips Claritin Cepacol throat lozenges Chloraseptic throat spray Cold-Eeze- up to three times per day Cough  drops, alcohol free Flonase (by prescription only) Guaifenesin Mucinex Robitussin DM (plain only, alcohol free) Saline nasal spray/drops Sudafed (pseudoephedrine) & Actifed ** use only after [redacted] weeks gestation and if you do not have high blood pressure Tylenol Vicks Vaporub Zinc lozenges Zyrtec   Constipation: Colace Ducolax suppositories Fleet enema Glycerin suppositories Metamucil Milk of magnesia Miralax Senokot Smooth move tea  Diarrhea: Kaopectate Imodium A-D  *NO pepto Bismol  Hemorrhoids: Anusol Anusol HC Preparation H Tucks  Indigestion: Tums Maalox Mylanta Zantac  Pepcid  Insomnia: Benadryl (alcohol free) 25mg every 6 hours as needed Tylenol PM Unisom, no Gelcaps  Leg Cramps: Tums MagGel  Nausea/Vomiting:  Bonine Dramamine Emetrol Ginger extract Sea bands Meclizine  Nausea medication to take during pregnancy:  Unisom (doxylamine succinate 25 mg tablets) Take one tablet daily at bedtime. If symptoms are not adequately controlled, the dose can be increased to a maximum recommended dose of two tablets daily (1/2 tablet in the morning, 1/2 tablet mid-afternoon and one at bedtime). Vitamin B6 100mg tablets. Take one tablet twice a day (up to 200 mg per day).  Skin Rashes: Aveeno products Benadryl cream or 25mg every 6 hours as needed Calamine Lotion 1% cortisone cream  Yeast infection: Gyne-lotrimin 7 Monistat 7   **If taking multiple medications, please check labels to avoid duplicating the same active ingredients **take medication as directed on the label ** Do not exceed 4000 mg of tylenol in 24 hours **Do not take medications that contain aspirin or ibuprofen     

## 2016-04-11 ENCOUNTER — Encounter: Payer: Self-pay | Admitting: Family

## 2016-04-11 ENCOUNTER — Other Ambulatory Visit (HOSPITAL_COMMUNITY)
Admission: RE | Admit: 2016-04-11 | Discharge: 2016-04-11 | Disposition: A | Discharge status: Home / Self Care | Payer: Medicaid Other | Source: Ambulatory Visit | Attending: Family | Admitting: Family

## 2016-04-11 ENCOUNTER — Ambulatory Visit (INDEPENDENT_AMBULATORY_CARE_PROVIDER_SITE_OTHER): Payer: Medicaid Other | Admitting: Family

## 2016-04-11 VITALS — BP 102/72 | HR 72 | Wt 119.2 lb

## 2016-04-11 DIAGNOSIS — N83209 Unspecified ovarian cyst, unspecified side: Secondary | ICD-10-CM

## 2016-04-11 DIAGNOSIS — O0990 Supervision of high risk pregnancy, unspecified, unspecified trimester: Secondary | ICD-10-CM | POA: Insufficient documentation

## 2016-04-11 DIAGNOSIS — O0991 Supervision of high risk pregnancy, unspecified, first trimester: Secondary | ICD-10-CM

## 2016-04-11 DIAGNOSIS — I341 Nonrheumatic mitral (valve) prolapse: Secondary | ICD-10-CM

## 2016-04-11 DIAGNOSIS — O3481 Maternal care for other abnormalities of pelvic organs, first trimester: Secondary | ICD-10-CM

## 2016-04-11 DIAGNOSIS — Z01419 Encounter for gynecological examination (general) (routine) without abnormal findings: Secondary | ICD-10-CM | POA: Insufficient documentation

## 2016-04-11 DIAGNOSIS — Z113 Encounter for screening for infections with a predominantly sexual mode of transmission: Secondary | ICD-10-CM | POA: Diagnosis present

## 2016-04-11 DIAGNOSIS — O99411 Diseases of the circulatory system complicating pregnancy, first trimester: Secondary | ICD-10-CM

## 2016-04-11 DIAGNOSIS — O99419 Diseases of the circulatory system complicating pregnancy, unspecified trimester: Secondary | ICD-10-CM | POA: Insufficient documentation

## 2016-04-11 DIAGNOSIS — O3480 Maternal care for other abnormalities of pelvic organs, unspecified trimester: Secondary | ICD-10-CM

## 2016-04-11 LAB — POCT URINALYSIS DIP (DEVICE)
Bilirubin Urine: NEGATIVE
Glucose, UA: NEGATIVE mg/dL
Ketones, ur: NEGATIVE mg/dL
NITRITE: NEGATIVE
PH: 6.5 (ref 5.0–8.0)
PROTEIN: NEGATIVE mg/dL
Specific Gravity, Urine: 1.025 (ref 1.005–1.030)
Urobilinogen, UA: 1 mg/dL (ref 0.0–1.0)

## 2016-04-11 MED ORDER — PRENATAL ADULT GUMMY/DHA/FA 0.4-25 MG PO CHEW: 1.0000 | CHEWABLE_TABLET | Freq: Every day | ORAL | 1 refills | Status: DC

## 2016-04-11 NOTE — Progress Notes (Signed)
DECLINE FLU AT THIS TIME 

## 2016-04-11 NOTE — Progress Notes (Signed)
Subjective:    Katelyn Fuentes is a G2P1001 6325w0d being seen today for her first obstetrical visit.  Her obstetrical history is significant for diagnosis of MVP and SVT in 2016 after a fainting episode.  Reports occasionally feeling racing heartbeat and just needs to slow down breathing and it resolves.  Hx of normal pregnancy and NSVD of 6lb baby.  . Patient uncertain about breastfeeding.  Felt increased pain with last baby, but open to trying again. Pregnancy history fully reviewed.  Patient reports no complaints.  Vitals:   04/11/16 0808  BP: 102/72  Pulse: 72  Weight: 119 lb 3.2 oz (54.1 kg)    HISTORY: OB History  Gravida Para Term Preterm AB Living  2 1 1     1   SAB TAB Ectopic Multiple Live Births          1    # Outcome Date GA Lbr Len/2nd Weight Sex Delivery Anes PTL Lv  2 Current           1 Term 04/12/11 528w6d 08:08 / 00:57 6 lb 15.6 oz (3.165 kg) M Vag-Spont EPI  LIV     Past Medical History:  Diagnosis Date  . Asthma    childhood  . Mitral valve prolapse   . SVT (supraventricular tachycardia) (HCC)    Past Surgical History:  Procedure Laterality Date  . NO PAST SURGERIES     Family History  Problem Relation Age of Onset  . Heart disease Mother   . Hypertension Mother   . Heart disease Maternal Grandmother   . Hypertension Maternal Grandmother   . Diabetes Neg Hx   . Heart failure Neg Hx   . Hyperlipidemia Neg Hx      Exam    BP 102/72   Pulse 72   Wt 119 lb 3.2 oz (54.1 kg)   LMP 01/18/2016 (Exact Date)   BMI 19.24 kg/m  Uterine Size: size equals dates  Pelvic Exam:    Perineum: No Hemorrhoids, Normal Perineum   Vulva: normal   Vagina:  normal mucosa, normal discharge, no palpable nodules   pH: Not done   Cervix: no bleeding following Pap, no cervical motion tenderness and no lesions   Adnexa: normal adnexa and no mass, fullness, tenderness   Bony Pelvis: Adequate  System: Breast:  No nipple retraction or dimpling, No nipple discharge or  bleeding, No axillary or supraclavicular adenopathy, Normal to palpation without dominant masses   Skin: normal coloration and turgor, no rashes    Neurologic: negative   Extremities: normal strength, tone, and muscle mass   HEENT neck supple with midline trachea and thyroid without masses   Mouth/Teeth mucous membranes moist, pharynx normal without lesions   Neck supple and no masses   Cardiovascular: regular rate and rhythm, no murmurs or gallops   Respiratory:  appears well, vitals normal, no respiratory distress, acyanotic, normal RR, neck free of mass or lymphadenopathy, chest clear, no wheezing, crepitations, rhonchi, normal symmetric air entry   Abdomen: soft, non-tender; bowel sounds normal; no masses,  no organomegaly   Urinary: urethral meatus normal      Assessment:    Pregnancy: G2P1001 Patient Active Problem List   Diagnosis Date Noted  . Supervision of high risk pregnancy, antepartum 04/11/2016  . Mitral valve prolapse of mother during pregnancy 04/11/2016  . Mitral valve anterior leaflet prolapse 12/09/2014  . SVT (supraventricular tachycardia) (HCC) 12/08/2014  . History of postpartum hemorrhage 04/12/2011  Plan:     Initial labs drawn.  Pap smear collected. Plans to schedule appt with cardiologist Prenatal vitamins ordered - gummies. Problem list reviewed and updated. Genetic Screening discussed First Screen: ordered. Follow up in 4 weeks.   Marlis EdelsonKARIM, Estelle Skibicki N 04/11/2016

## 2016-04-11 NOTE — Patient Instructions (Addendum)
Second Trimester of Pregnancy The second trimester is from week 13 through week 28 (months 4 through 6). The second trimester is often a time when you feel your best. Your body has also adjusted to being pregnant, and you begin to feel better physically. Usually, morning sickness has lessened or quit completely, you may have more energy, and you may have an increase in appetite. The second trimester is also a time when the fetus is growing rapidly. At the end of the sixth month, the fetus is about 9 inches long and weighs about 1 pounds. You will likely begin to feel the baby move (quickening) between 18 and 20 weeks of the pregnancy. Body changes during your second trimester Your body continues to go through many changes during your second trimester. The changes vary from woman to woman.  Your weight will continue to increase. You will notice your lower abdomen bulging out.  You may begin to get stretch marks on your hips, abdomen, and breasts.  You may develop headaches that can be relieved by medicines. The medicines should be approved by your health care provider.  You may urinate more often because the fetus is pressing on your bladder.  You may develop or continue to have heartburn as a result of your pregnancy.  You may develop constipation because certain hormones are causing the muscles that push waste through your intestines to slow down.  You may develop hemorrhoids or swollen, bulging veins (varicose veins).  You may have back pain. This is caused by:  Weight gain.  Pregnancy hormones that are relaxing the joints in your pelvis.  A shift in weight and the muscles that support your balance.  Your breasts will continue to grow and they will continue to become tender.  Your gums may bleed and may be sensitive to brushing and flossing.  Dark spots or blotches (chloasma, mask of pregnancy) may develop on your face. This will likely fade after the baby is born.  A dark line  from your belly button to the pubic area (linea nigra) may appear. This will likely fade after the baby is born.  You may have changes in your hair. These can include thickening of your hair, rapid growth, and changes in texture. Some women also have hair loss during or after pregnancy, or hair that feels dry or thin. Your hair will most likely return to normal after your baby is born. What to expect at prenatal visits During a routine prenatal visit:  You will be weighed to make sure you and the fetus are growing normally.  Your blood pressure will be taken.  Your abdomen will be measured to track your baby's growth.  The fetal heartbeat will be listened to.  Any test results from the previous visit will be discussed. Your health care provider may ask you:  How you are feeling.  If you are feeling the baby move.  If you have had any abnormal symptoms, such as leaking fluid, bleeding, severe headaches, or abdominal cramping.  If you are using any tobacco products, including cigarettes, chewing tobacco, and electronic cigarettes.  If you have any questions. Other tests that may be performed during your second trimester include:  Blood tests that check for:  Low iron levels (anemia).  Gestational diabetes (between 24 and 28 weeks).  Rh antibodies. This is to check for a protein on red blood cells (Rh factor).  Urine tests to check for infections, diabetes, or protein in the urine.  An ultrasound to   confirm the proper growth and development of the baby.  An amniocentesis to check for possible genetic problems.  Fetal screens for spina bifida and Down syndrome.  HIV (human immunodeficiency virus) testing. Routine prenatal testing includes screening for HIV, unless you choose not to have this test. Follow these instructions at home: Eating and drinking  Continue to eat regular, healthy meals.  Avoid raw meat, uncooked cheese, cat litter boxes, and soil used by cats. These  carry germs that can cause birth defects in the baby.  Take your prenatal vitamins.  Take 1500-2000 mg of calcium daily starting at the 20th week of pregnancy until you deliver your baby.  If you develop constipation:  Take over-the-counter or prescription medicines.  Drink enough fluid to keep your urine clear or pale yellow.  Eat foods that are high in fiber, such as fresh fruits and vegetables, whole grains, and beans.  Limit foods that are high in fat and processed sugars, such as fried and sweet foods. Activity  Exercise only as directed by your health care provider. Experiencing uterine cramps is a good sign to stop exercising.  Avoid heavy lifting, wear low heel shoes, and practice good posture.  Wear your seat belt at all times when driving.  Rest with your legs elevated if you have leg cramps or low back pain.  Wear a good support bra for breast tenderness.  Do not use hot tubs, steam rooms, or saunas. Lifestyle  Avoid all smoking, herbs, alcohol, and unprescribed drugs. These chemicals affect the formation and growth of the baby.  Do not use any products that contain nicotine or tobacco, such as cigarettes and e-cigarettes. If you need help quitting, ask your health care provider.  A sexual relationship may be continued unless your health care provider directs you otherwise. General instructions  Follow your health care provider's instructions regarding medicine use. There are medicines that are either safe or unsafe to take during pregnancy.  Take warm sitz baths to soothe any pain or discomfort caused by hemorrhoids. Use hemorrhoid cream if your health care provider approves.  If you develop varicose veins, wear support hose. Elevate your feet for 15 minutes, 3-4 times a day. Limit salt in your diet.  Visit your dentist if you have not gone yet during your pregnancy. Use a soft toothbrush to brush your teeth and be gentle when you floss.  Keep all follow-up  prenatal visits as told by your health care provider. This is important. Contact a health care provider if:  You have dizziness.  You have mild pelvic cramps, pelvic pressure, or nagging pain in the abdominal area.  You have persistent nausea, vomiting, or diarrhea.  You have a bad smelling vaginal discharge.  You have pain with urination. Get help right away if:  You have a fever.  You are leaking fluid from your vagina.  You have spotting or bleeding from your vagina.  You have severe abdominal cramping or pain.  You have rapid weight gain or weight loss.  You have shortness of breath with chest pain.  You notice sudden or extreme swelling of your face, hands, ankles, feet, or legs.  You have not felt your baby move in over an hour.  You have severe headaches that do not go away with medicine.  You have vision changes. Summary  The second trimester is from week 13 through week 28 (months 4 through 6). It is also a time when the fetus is growing rapidly.  Your body goes   through many changes during pregnancy. The changes vary from woman to woman.  Avoid all smoking, herbs, alcohol, and unprescribed drugs. These chemicals affect the formation and growth your baby.  Do not use any tobacco products, such as cigarettes, chewing tobacco, and e-cigarettes. If you need help quitting, ask your health care provider.  Contact your health care provider if you have any questions. Keep all prenatal visits as told by your health care provider. This is important. This information is not intended to replace advice given to you by your health care provider. Make sure you discuss any questions you have with your health care provider. Document Released: 04/11/2001 Document Revised: 09/23/2015 Document Reviewed: 06/18/2012 Elsevier Interactive Patient Education  2017 [employer]  

## 2016-04-12 ENCOUNTER — Encounter: Payer: Self-pay | Admitting: Obstetrics and Gynecology

## 2016-04-12 LAB — PRENATAL PROFILE (SOLSTAS)
Antibody Screen: NEGATIVE
BASOS ABS: 0 {cells}/uL (ref 0–200)
Basophils Relative: 0 %
Eosinophils Absolute: 45 cells/uL (ref 15–500)
Eosinophils Relative: 1 %
HCT: 33.6 % — ABNORMAL LOW (ref 35.0–45.0)
HEP B S AG: NEGATIVE
HIV: NONREACTIVE
Hemoglobin: 11.2 g/dL — ABNORMAL LOW (ref 11.7–15.5)
LYMPHS ABS: 1485 {cells}/uL (ref 850–3900)
Lymphocytes Relative: 33 %
MCH: 28.7 pg (ref 27.0–33.0)
MCHC: 33.3 g/dL (ref 32.0–36.0)
MCV: 86.2 fL (ref 80.0–100.0)
MONO ABS: 270 {cells}/uL (ref 200–950)
MPV: 9.8 fL (ref 7.5–12.5)
Monocytes Relative: 6 %
NEUTROS PCT: 60 %
Neutro Abs: 2700 cells/uL (ref 1500–7800)
PLATELETS: 299 10*3/uL (ref 140–400)
RBC: 3.9 MIL/uL (ref 3.80–5.10)
RDW: 14.5 % (ref 11.0–15.0)
RH TYPE: POSITIVE
RUBELLA: 4.79 {index} — AB (ref <0.90)
WBC: 4.5 10*3/uL (ref 3.8–10.8)

## 2016-04-13 LAB — HEMOGLOBINOPATHY EVALUATION
HEMATOCRIT: 33.6 % — AB (ref 35.0–45.0)
HGB A: 96.5 % (ref >96.0)
Hemoglobin: 11.2 g/dL — ABNORMAL LOW (ref 11.7–15.5)
Hgb A2 Quant: 2.5 % (ref 1.8–3.5)
MCH: 28.7 pg (ref 27.0–33.0)
MCV: 86.2 fL (ref 80.0–100.0)
RBC: 3.9 MIL/uL (ref 3.80–5.10)
RDW: 14.5 % (ref 11.0–15.0)

## 2016-04-13 LAB — CYTOLOGY - PAP
CHLAMYDIA, DNA PROBE: NEGATIVE
DIAGNOSIS: NEGATIVE
Neisseria Gonorrhea: NEGATIVE

## 2016-04-18 ENCOUNTER — Encounter (HOSPITAL_COMMUNITY): Payer: Self-pay

## 2016-04-18 ENCOUNTER — Ambulatory Visit (HOSPITAL_COMMUNITY)
Admission: RE | Admit: 2016-04-18 | Discharge: 2016-04-18 | Disposition: A | Discharge status: Home / Self Care | Payer: Medicaid Other | Source: Ambulatory Visit | Attending: Family | Admitting: Family

## 2016-04-18 ENCOUNTER — Other Ambulatory Visit: Payer: Self-pay | Admitting: *Deleted

## 2016-04-18 DIAGNOSIS — Z3A13 13 weeks gestation of pregnancy: Secondary | ICD-10-CM | POA: Insufficient documentation

## 2016-04-18 DIAGNOSIS — Z363 Encounter for antenatal screening for malformations: Secondary | ICD-10-CM | POA: Insufficient documentation

## 2016-04-18 DIAGNOSIS — O0990 Supervision of high risk pregnancy, unspecified, unspecified trimester: Secondary | ICD-10-CM

## 2016-05-01 NOTE — L&D Delivery Note (Signed)
26 y.o. [id number] at 3138w0d delivered a viable female infant in cephalic, LOA position. Loose nuchal cord x1, delivered through with ease then reduced. Right anterior shoulder delivered with ease. 60 sec delayed cord clamping. Cord clamped x2 and cut. Placenta delivered spontaneously intact, with 3VC. Fundus firm on exam with massage and pitocin. Had continued trickling of VB, and due to previous PPH, 400mcg PR cytotec given. Good hemostasis noted.  Anesthesia: Epidural; Local anesthesia Laceration: Bilateral labial Suture: 4-0 Monocryl Good hemostasis noted. EBL: 250cc  Mom and baby recovering in LDR.    Apgars: APGAR (1 MIN): 9   APGAR (5 MINS): 9    Weight: Pending skin to skin  Sponge and instrument count were correct x2. Placenta sent to L&D.  Jen MowElizabeth Mumaw, DO [facility] 09/29/2016, 7:13 PM

## 2016-05-09 ENCOUNTER — Ambulatory Visit (INDEPENDENT_AMBULATORY_CARE_PROVIDER_SITE_OTHER): Payer: Medicaid Other | Admitting: Advanced Practice Midwife

## 2016-05-09 VITALS — BP 104/62 | HR 82 | Wt 124.4 lb

## 2016-05-09 DIAGNOSIS — Z3492 Encounter for supervision of normal pregnancy, unspecified, second trimester: Secondary | ICD-10-CM

## 2016-05-09 DIAGNOSIS — O99419 Diseases of the circulatory system complicating pregnancy, unspecified trimester: Secondary | ICD-10-CM

## 2016-05-09 DIAGNOSIS — R102 Pelvic and perineal pain: Secondary | ICD-10-CM

## 2016-05-09 DIAGNOSIS — I341 Nonrheumatic mitral (valve) prolapse: Secondary | ICD-10-CM

## 2016-05-09 DIAGNOSIS — Z3689 Encounter for other specified antenatal screening: Secondary | ICD-10-CM

## 2016-05-09 DIAGNOSIS — O26892 Other specified pregnancy related conditions, second trimester: Secondary | ICD-10-CM

## 2016-05-09 DIAGNOSIS — O26899 Other specified pregnancy related conditions, unspecified trimester: Secondary | ICD-10-CM

## 2016-05-09 NOTE — Progress Notes (Signed)
   PRENATAL VISIT NOTE  Subjective:  Katelyn Fuentes is a [age] y.[place]. G2P1001 at 7046w0d being seen today for ongoing prenatal care.  She is currently monitored for the following issues for this low-risk pregnancy and has History of postpartum hemorrhage; SVT (supraventricular tachycardia) (HCC); Mitral valve anterior leaflet prolapse; Supervision of high risk pregnancy, antepartum; Mitral valve prolapse of mother during pregnancy; and Ovarian cyst affecting pregnancy, antepartum on her problem list.  Patient reports pain in right lower abdomen x 2 weeks that is intermittent not affected by movement/position, improved with warm shower.  Contractions: Not present. Vag. Bleeding: None.  Movement: Present. Denies leaking of fluid.   The following portions of the patient's history were reviewed and updated as appropriate: allergies, current medications, past family history, past medical history, past social history, past surgical history and problem list. Problem list updated.  Objective:   Vitals:   05/09/16 0756  BP: 104/62  Pulse: 82  Weight: 124 lb 6.4 oz (56.4 kg)    Fetal Status: Fetal Heart Rate (bpm): 147   Movement: Present     General:  Alert, oriented and cooperative. Patient is in no acute distress.  Skin: Skin is warm and dry. No rash noted.   Cardiovascular: Normal heart rate noted  Respiratory: Normal respiratory effort, no problems with respiration noted  Abdomen: Soft, gravid, appropriate for gestational age. Pain/Pressure: Present     Pelvic:  Cervical exam deferred        Extremities: Normal range of motion.  Edema: None  Mental Status: Normal mood and affect. Normal behavior. Normal judgment and thought content.   Assessment and Plan:  Pregnancy: G2P1001 at 3746w0d  1. Supervision of low-risk pregnancy, second trimester  - US MFM OB COMP + 14 WK; Future  2. Encounter for fetal anatomic survey  - US MFM OB COMP + 14 WK; Future  3. Pain of round ligament affecting  pregnancy, antepartum --Rest/ice/heat/warm bath/tylenol for pain.  Reviewed warning signs/reasons to call office or come to [facility].  4. Mitral valve prolapse of mother during pregnancy --Pt cleared by cardiology in past.  Normal first pregnancy without complications.  Referral for cardiology visit this pregnancy.   Preterm labor symptoms and general obstetric precautions including but not limited to vaginal bleeding, contractions, leaking of fluid and fetal movement were reviewed in detail with the patient. Please refer to After Visit Summary for other counseling recommendations.  Return in about 4 weeks (around 06/06/2016).   Hurshel PartyLisa A Leftwich-Kirby, CNM

## 2016-05-09 NOTE — Patient Instructions (Signed)
Second Trimester of Pregnancy The second trimester is from week 13 through week 28 (months 4 through 6). The second trimester is often a time when you feel your best. Your body has also adjusted to being pregnant, and you begin to feel better physically. Usually, morning sickness has lessened or quit completely, you may have more energy, and you may have an increase in appetite. The second trimester is also a time when the fetus is growing rapidly. At the end of the sixth month, the fetus is about 9 inches long and weighs about 1 pounds. You will likely begin to feel the baby move (quickening) between 18 and 20 weeks of the pregnancy. Body changes during your second trimester Your body continues to go through many changes during your second trimester. The changes vary from woman to woman.  Your weight will continue to increase. You will notice your lower abdomen bulging out.  You may begin to get stretch marks on your hips, abdomen, and breasts.  You may develop headaches that can be relieved by medicines. The medicines should be approved by your health care provider.  You may urinate more often because the fetus is pressing on your bladder.  You may develop or continue to have heartburn as a result of your pregnancy.  You may develop constipation because certain hormones are causing the muscles that push waste through your intestines to slow down.  You may develop hemorrhoids or swollen, bulging veins (varicose veins).  You may have back pain. This is caused by:  Weight gain.  Pregnancy hormones that are relaxing the joints in your pelvis.  A shift in weight and the muscles that support your balance.  Your breasts will continue to grow and they will continue to become tender.  Your gums may bleed and may be sensitive to brushing and flossing.  Dark spots or blotches (chloasma, mask of pregnancy) may develop on your face. This will likely fade after the baby is born.  A dark line  from your belly button to the pubic area (linea nigra) may appear. This will likely fade after the baby is born.  You may have changes in your hair. These can include thickening of your hair, rapid growth, and changes in texture. Some women also have hair loss during or after pregnancy, or hair that feels dry or thin. Your hair will most likely return to normal after your baby is born. What to expect at prenatal visits During a routine prenatal visit:  You will be weighed to make sure you and the fetus are growing normally.  Your blood pressure will be taken.  Your abdomen will be measured to track your baby's growth.  The fetal heartbeat will be listened to.  Any test results from the previous visit will be discussed. Your health care provider may ask you:  How you are feeling.  If you are feeling the baby move.  If you have had any abnormal symptoms, such as leaking fluid, bleeding, severe headaches, or abdominal cramping.  If you are using any tobacco products, including cigarettes, chewing tobacco, and electronic cigarettes.  If you have any questions. Other tests that may be performed during your second trimester include:  Blood tests that check for:  Low iron levels (anemia).  Gestational diabetes (between 24 and 28 weeks).  Rh antibodies. This is to check for a protein on red blood cells (Rh factor).  Urine tests to check for infections, diabetes, or protein in the urine.  An ultrasound to   confirm the proper growth and development of the baby.  An amniocentesis to check for possible genetic problems.  Fetal screens for spina bifida and Down syndrome.  HIV (human immunodeficiency virus) testing. Routine prenatal testing includes screening for HIV, unless you choose not to have this test. Follow these instructions at home: Eating and drinking  Continue to eat regular, healthy meals.  Avoid raw meat, uncooked cheese, cat litter boxes, and soil used by cats. These  carry germs that can cause birth defects in the baby.  Take your prenatal vitamins.  Take 1500-2000 mg of calcium daily starting at the 20th week of pregnancy until you deliver your baby.  If you develop constipation:  Take over-the-counter or prescription medicines.  Drink enough fluid to keep your urine clear or pale yellow.  Eat foods that are high in fiber, such as fresh fruits and vegetables, whole grains, and beans.  Limit foods that are high in fat and processed sugars, such as fried and sweet foods. Activity  Exercise only as directed by your health care provider. Experiencing uterine cramps is a good sign to stop exercising.  Avoid heavy lifting, wear low heel shoes, and practice good posture.  Wear your seat belt at all times when driving.  Rest with your legs elevated if you have leg cramps or low back pain.  Wear a good support bra for breast tenderness.  Do not use hot tubs, steam rooms, or saunas. Lifestyle  Avoid all smoking, herbs, alcohol, and unprescribed drugs. These chemicals affect the formation and growth of the baby.  Do not use any products that contain nicotine or tobacco, such as cigarettes and e-cigarettes. If you need help quitting, ask your health care provider.  A sexual relationship may be continued unless your health care provider directs you otherwise. General instructions  Follow your health care provider's instructions regarding medicine use. There are medicines that are either safe or unsafe to take during pregnancy.  Take warm sitz baths to soothe any pain or discomfort caused by hemorrhoids. Use hemorrhoid cream if your health care provider approves.  If you develop varicose veins, wear support hose. Elevate your feet for 15 minutes, 3-4 times a day. Limit salt in your diet.  Visit your dentist if you have not gone yet during your pregnancy. Use a soft toothbrush to brush your teeth and be gentle when you floss.  Keep all follow-up  prenatal visits as told by your health care provider. This is important. Contact a health care provider if:  You have dizziness.  You have mild pelvic cramps, pelvic pressure, or nagging pain in the abdominal area.  You have persistent nausea, vomiting, or diarrhea.  You have a bad smelling vaginal discharge.  You have pain with urination. Get help right away if:  You have a fever.  You are leaking fluid from your vagina.  You have spotting or bleeding from your vagina.  You have severe abdominal cramping or pain.  You have rapid weight gain or weight loss.  You have shortness of breath with chest pain.  You notice sudden or extreme swelling of your face, hands, ankles, feet, or legs.  You have not felt your baby move in over an hour.  You have severe headaches that do not go away with medicine.  You have vision changes. Summary  The second trimester is from week 13 through week 28 (months 4 through 6). It is also a time when the fetus is growing rapidly.  Your body goes   through many changes during pregnancy. The changes vary from woman to woman.  Avoid all smoking, herbs, alcohol, and unprescribed drugs. These chemicals affect the formation and growth your baby.  Do not use any tobacco products, such as cigarettes, chewing tobacco, and e-cigarettes. If you need help quitting, ask your health care provider.  Contact your health care provider if you have any questions. Keep all prenatal visits as told by your health care provider. This is important. This information is not intended to replace advice given to you by your health care provider. Make sure you discuss any questions you have with your health care provider. Document Released: 04/11/2001 Document Revised: 09/23/2015 Document Reviewed: 06/18/2012 Elsevier Interactive Patient Education  2017 [employer] Inc.  

## 2016-05-30 ENCOUNTER — Other Ambulatory Visit: Payer: Self-pay | Admitting: Advanced Practice Midwife

## 2016-05-30 ENCOUNTER — Ambulatory Visit (HOSPITAL_COMMUNITY)
Admission: RE | Admit: 2016-05-30 | Discharge: 2016-05-30 | Disposition: A | Discharge status: Home / Self Care | Payer: Medicaid Other | Source: Ambulatory Visit | Attending: Advanced Practice Midwife | Admitting: Advanced Practice Midwife

## 2016-05-30 DIAGNOSIS — Z3A19 19 weeks gestation of pregnancy: Secondary | ICD-10-CM

## 2016-05-30 DIAGNOSIS — Z363 Encounter for antenatal screening for malformations: Secondary | ICD-10-CM

## 2016-05-30 DIAGNOSIS — Z3492 Encounter for supervision of normal pregnancy, unspecified, second trimester: Secondary | ICD-10-CM

## 2016-05-30 DIAGNOSIS — Z3689 Encounter for other specified antenatal screening: Secondary | ICD-10-CM

## 2016-05-30 DIAGNOSIS — O0990 Supervision of high risk pregnancy, unspecified, unspecified trimester: Secondary | ICD-10-CM

## 2016-06-06 ENCOUNTER — Ambulatory Visit (INDEPENDENT_AMBULATORY_CARE_PROVIDER_SITE_OTHER): Payer: Medicaid Other | Admitting: Family Medicine

## 2016-06-06 VITALS — BP 111/58 | HR 77 | Wt 125.6 lb

## 2016-06-06 DIAGNOSIS — O99419 Diseases of the circulatory system complicating pregnancy, unspecified trimester: Principal | ICD-10-CM

## 2016-06-06 DIAGNOSIS — Z8759 Personal history of other complications of pregnancy, childbirth and the puerperium: Secondary | ICD-10-CM

## 2016-06-06 DIAGNOSIS — O99412 Diseases of the circulatory system complicating pregnancy, second trimester: Secondary | ICD-10-CM

## 2016-06-06 DIAGNOSIS — I471 Supraventricular tachycardia: Secondary | ICD-10-CM

## 2016-06-06 DIAGNOSIS — O0990 Supervision of high risk pregnancy, unspecified, unspecified trimester: Secondary | ICD-10-CM

## 2016-06-06 DIAGNOSIS — Z862 Personal history of diseases of the blood and blood-forming organs and certain disorders involving the immune mechanism: Secondary | ICD-10-CM

## 2016-06-06 DIAGNOSIS — I341 Nonrheumatic mitral (valve) prolapse: Secondary | ICD-10-CM

## 2016-06-06 NOTE — Patient Instructions (Addendum)
Second Trimester of Pregnancy The second trimester is from week 13 through week 28, month 4 through 6. This is often the time in pregnancy that you feel your best. Often times, morning sickness has lessened or quit. You may have more energy, and you may get hungry more often. Your unborn baby (fetus) is growing rapidly. At the end of the sixth month, he or she is about 9 inches long and weighs about 1 pounds. You will likely feel the baby move (quickening) between 18 and 20 weeks of pregnancy. Follow these instructions at home:  Avoid all smoking, herbs, and alcohol. Avoid drugs not approved by your doctor.  Do not use any tobacco products, including cigarettes, chewing tobacco, and electronic cigarettes. If you need help quitting, ask your doctor. You may get counseling or other support to help you quit.  Only take medicine as told by your doctor. Some medicines are safe and some are not during pregnancy.  Exercise only as told by your doctor. Stop exercising if you start having cramps.  Eat regular, healthy meals.  Wear a good support bra if your breasts are tender.  Do not use hot tubs, steam rooms, or saunas.  Wear your seat belt when driving.  Avoid raw meat, uncooked cheese, and liter boxes and soil used by cats.  Take your prenatal vitamins.  Take 1500-2000 milligrams of calcium daily starting at the 20th week of pregnancy until you deliver your baby.  Try taking medicine that helps you poop (stool softener) as needed, and if your doctor approves. Eat more fiber by eating fresh fruit, vegetables, and whole grains. Drink enough fluids to keep your pee (urine) clear or pale yellow.  Take warm water baths (sitz baths) to soothe pain or discomfort caused by hemorrhoids. Use hemorrhoid cream if your doctor approves.  If you have puffy, bulging veins (varicose veins), wear support hose. Raise (elevate) your feet for 15 minutes, 3-4 times a day. Limit salt in your diet.  Avoid heavy  lifting, wear low heals, and sit up straight.  Rest with your legs raised if you have leg cramps or low back pain.  Visit your dentist if you have not gone during your pregnancy. Use a soft toothbrush to brush your teeth. Be gentle when you floss.  You can have sex (intercourse) unless your doctor tells you not to.  Go to your doctor visits. Get help if:  You feel dizzy.  You have mild cramps or pressure in your lower belly (abdomen).  You have a nagging pain in your belly area.  You continue to feel sick to your stomach (nauseous), throw up (vomit), or have watery poop (diarrhea).  You have bad smelling fluid coming from your vagina.  You have pain with peeing (urination). Get help right away if:  You have a fever.  You are leaking fluid from your vagina.  You have spotting or bleeding from your vagina.  You have severe belly cramping or pain.  You lose or gain weight rapidly.  You have trouble catching your breath and have chest pain.  You notice sudden or extreme puffiness (swelling) of your face, hands, ankles, feet, or legs.  You have not felt the baby move in over an hour.  You have severe headaches that do not go away with medicine.  You have vision changes. This information is not intended to replace advice given to you by your health care provider. Make sure you discuss any questions you have with your health care   provider. Document Released: 07/12/2009 Document Revised: 09/23/2015 Document Reviewed: 06/18/2012 Elsevier Interactive Patient Education  2017 [employer]  

## 2016-06-06 NOTE — Progress Notes (Signed)
   Subjective:  Katelyn Fuentes is a 26 y.o. G2P1001 at 6410w0d being seen today for ongoing prenatal care.  She is currently monitored for the following issues for this high-risk pregnancy and has History of postpartum hemorrhage; SVT (supraventricular tachycardia) (HCC); Mitral valve anterior leaflet prolapse; Supervision of high risk pregnancy, antepartum; Mitral valve prolapse of mother during pregnancy; and Ovarian cyst affecting pregnancy, antepartum on her problem list.  Patient reports no complaints. Denies any symptoms of SVT. No dizziness. Contractions: Not present. Vag. Bleeding: None.  Movement: Present. Denies leaking of fluid.   The following portions of the patient's history were reviewed and updated as appropriate: allergies, current medications, past family history, past medical history, past social history, past surgical history and problem list. Problem list updated.  Objective:   Vitals:   06/06/16 0938  BP: (!) 111/58  Pulse: 77  Weight: 125 lb 9.6 oz (57 kg)    Fetal Status: Fetal Heart Rate (bpm): 145 Fundal Height: 18 cm Movement: Present     General:  Alert, oriented and cooperative. Patient is in no acute distress.  Skin: Skin is warm and dry. No rash noted.   Cardiovascular: Normal heart rate noted. No murmur noted.  Respiratory: Normal respiratory effort, no problems with respiration noted  Abdomen: Soft, gravid, appropriate for gestational age. Pain/Pressure: Present     Pelvic:  Cervical exam deferred        Extremities: Normal range of motion.  Edema: None  Mental Status: Normal mood and affect. Normal behavior. Normal judgment and thought content.   Urinalysis:      Assessment and Plan:  Pregnancy: G2P1001 at 6110w0d  1. Supervision of high risk pregnancy, antepartum - Anatomy scan to be completed in 4 weeks - Desires OCPs/POPs (pending breastfeeding) vs NuvaRing - Does not desire circumcision  2. Mitral valve prolapse of mother during pregnancy -  Very mild without regurgitation in 2016 - Has referral for cardiology, awaiting call back (insurance is verifying)  3. SVT (supraventricular tachycardia) (HCC) - No recent signs/symptoms of SVT   4. History of postpartum hemorrhage - Discussed risks in future, willing to accept blood products if occurs again - Starting Hb 11.0  Preterm labor symptoms and general obstetric precautions including but not limited to vaginal bleeding, contractions, leaking of fluid and fetal movement were reviewed in detail with the patient. Please refer to After Visit Summary for other counseling recommendations.  Return in about 4 weeks (around 07/04/2016) for Routine OB visit.   Michaele OfferElizabeth Woodland Mumaw, DO OB Fellow [facility]

## 2016-07-04 ENCOUNTER — Ambulatory Visit (HOSPITAL_COMMUNITY): Payer: Medicaid Other

## 2016-07-04 ENCOUNTER — Ambulatory Visit (INDEPENDENT_AMBULATORY_CARE_PROVIDER_SITE_OTHER): Payer: Medicaid Other | Admitting: Obstetrics and Gynecology

## 2016-07-04 VITALS — BP 109/59 | HR 88 | Wt 130.6 lb

## 2016-07-04 DIAGNOSIS — O0992 Supervision of high risk pregnancy, unspecified, second trimester: Secondary | ICD-10-CM

## 2016-07-04 DIAGNOSIS — O0990 Supervision of high risk pregnancy, unspecified, unspecified trimester: Secondary | ICD-10-CM

## 2016-07-04 NOTE — Progress Notes (Signed)
   PRENATAL VISIT NOTE  Subjective:  Katelyn Fuentes is a 26 y.o. G2P1001 at 6327w0d being seen today for ongoing prenatal care.  She is currently monitored for the following issues for this high-risk pregnancy and has History of postpartum hemorrhage; SVT (supraventricular tachycardia) (HCC); Mitral valve anterior leaflet prolapse; Supervision of high risk pregnancy, antepartum; Mitral valve prolapse of mother during pregnancy; and Ovarian cyst affecting pregnancy, antepartum on her problem list.  Patient reports no complaints.  Contractions: Not present. Vag. Bleeding: None.  Movement: Present. Denies leaking of fluid.   The following portions of the patient's history were reviewed and updated as appropriate: allergies, current medications, past family history, past medical history, past social history, past surgical history and problem list. Problem list updated.  Objective:   Vitals:   07/04/16 0933  BP: (!) 109/59  Pulse: 88  Weight: 130 lb 9.6 oz (59.2 kg)    Fetal Status: Fetal Heart Rate (bpm): 140   Movement: Present     General:  Alert, oriented and cooperative. Patient is in no acute distress.  Skin: Skin is warm and dry. No rash noted.   Cardiovascular: Normal heart rate noted  Respiratory: Normal respiratory effort, no problems with respiration noted  Abdomen: Soft, gravid, appropriate for gestational age. Pain/Pressure: Absent     Pelvic:  Cervical exam deferred        Extremities: Normal range of motion.  Edema: None  Mental Status: Normal mood and affect. Normal behavior. Normal judgment and thought content.   Assessment and Plan:  Pregnancy: G2P1001 at 3527w0d  1. Supervision of high risk pregnancy, antepartum -patient with mild mitral valve prolapse, has not been able to see cards - referral reordered as it was not completely filled out last time. Asking for recommendations on pushing and labor -up to date -plan for 2 hr GTT and Tdap at next visit.   Preterm labor  symptoms and general obstetric precautions including but not limited to vaginal bleeding, contractions, leaking of fluid and fetal movement were reviewed in detail with the patient. Please refer to After Visit Summary for other counseling recommendations.  Return in about 4 weeks (around 08/01/2016), or fasting for 2hr GTT prefer 740 appt.   Lorne SkeensNicholas Michael Adrinne Sze, MD

## 2016-07-04 NOTE — Patient Instructions (Signed)

## 2016-07-12 ENCOUNTER — Other Ambulatory Visit: Payer: Self-pay | Admitting: Family Medicine

## 2016-07-12 ENCOUNTER — Ambulatory Visit (HOSPITAL_COMMUNITY)
Admission: RE | Admit: 2016-07-12 | Discharge: 2016-07-12 | Disposition: A | Discharge status: Home / Self Care | Payer: Medicaid Other | Source: Ambulatory Visit | Attending: Family Medicine | Admitting: Family Medicine

## 2016-07-12 DIAGNOSIS — O2692 Pregnancy related conditions, unspecified, second trimester: Secondary | ICD-10-CM

## 2016-07-12 DIAGNOSIS — Z362 Encounter for other antenatal screening follow-up: Secondary | ICD-10-CM | POA: Insufficient documentation

## 2016-07-12 DIAGNOSIS — IMO0002 Reserved for concepts with insufficient information to code with codable children: Secondary | ICD-10-CM

## 2016-07-12 DIAGNOSIS — I341 Nonrheumatic mitral (valve) prolapse: Secondary | ICD-10-CM

## 2016-07-12 DIAGNOSIS — I471 Supraventricular tachycardia: Secondary | ICD-10-CM

## 2016-07-12 DIAGNOSIS — Z862 Personal history of diseases of the blood and blood-forming organs and certain disorders involving the immune mechanism: Secondary | ICD-10-CM

## 2016-07-12 DIAGNOSIS — O99419 Diseases of the circulatory system complicating pregnancy, unspecified trimester: Secondary | ICD-10-CM

## 2016-07-12 DIAGNOSIS — Z3A25 25 weeks gestation of pregnancy: Secondary | ICD-10-CM | POA: Insufficient documentation

## 2016-07-12 DIAGNOSIS — O0990 Supervision of high risk pregnancy, unspecified, unspecified trimester: Secondary | ICD-10-CM

## 2016-07-12 DIAGNOSIS — Z0489 Encounter for examination and observation for other specified reasons: Secondary | ICD-10-CM

## 2016-07-12 DIAGNOSIS — Z8759 Personal history of other complications of pregnancy, childbirth and the puerperium: Secondary | ICD-10-CM

## 2016-08-02 ENCOUNTER — Encounter: Payer: Medicaid Other | Admitting: Obstetrics and Gynecology

## 2016-08-02 ENCOUNTER — Encounter: Payer: Self-pay | Admitting: General Practice

## 2016-08-03 ENCOUNTER — Encounter (HOSPITAL_COMMUNITY): Payer: Self-pay | Admitting: *Deleted

## 2016-08-03 ENCOUNTER — Observation Stay (HOSPITAL_COMMUNITY)
Admission: AD | Admit: 2016-08-03 | Discharge: 2016-08-03 | Disposition: A | Discharge status: Home / Self Care | Payer: Medicaid Other | Source: Ambulatory Visit | Attending: Obstetrics & Gynecology | Admitting: Obstetrics & Gynecology

## 2016-08-03 DIAGNOSIS — I341 Nonrheumatic mitral (valve) prolapse: Secondary | ICD-10-CM

## 2016-08-03 DIAGNOSIS — J45909 Unspecified asthma, uncomplicated: Secondary | ICD-10-CM | POA: Diagnosis not present

## 2016-08-03 DIAGNOSIS — O99419 Diseases of the circulatory system complicating pregnancy, unspecified trimester: Secondary | ICD-10-CM

## 2016-08-03 DIAGNOSIS — Z3A28 28 weeks gestation of pregnancy: Secondary | ICD-10-CM | POA: Diagnosis not present

## 2016-08-03 DIAGNOSIS — O3480 Maternal care for other abnormalities of pelvic organs, unspecified trimester: Secondary | ICD-10-CM

## 2016-08-03 DIAGNOSIS — N83209 Unspecified ovarian cyst, unspecified side: Secondary | ICD-10-CM

## 2016-08-03 DIAGNOSIS — O479 False labor, unspecified: Secondary | ICD-10-CM | POA: Diagnosis present

## 2016-08-03 DIAGNOSIS — O4700 False labor before 37 completed weeks of gestation, unspecified trimester: Secondary | ICD-10-CM | POA: Diagnosis present

## 2016-08-03 DIAGNOSIS — O0990 Supervision of high risk pregnancy, unspecified, unspecified trimester: Secondary | ICD-10-CM

## 2016-08-03 LAB — URINALYSIS, ROUTINE W REFLEX MICROSCOPIC
Bilirubin Urine: NEGATIVE
Glucose, UA: NEGATIVE mg/dL
Hgb urine dipstick: NEGATIVE
KETONES UR: NEGATIVE mg/dL
Nitrite: NEGATIVE
PROTEIN: NEGATIVE mg/dL
Specific Gravity, Urine: 1.005 (ref 1.005–1.030)
pH: 7 (ref 5.0–8.0)

## 2016-08-03 LAB — FETAL FIBRONECTIN: FETAL FIBRONECTIN: NEGATIVE

## 2016-08-03 LAB — TYPE AND SCREEN
ABO/RH(D): B POS
ANTIBODY SCREEN: NEGATIVE

## 2016-08-03 LAB — OB RESULTS CONSOLE GBS: GBS: NEGATIVE

## 2016-08-03 LAB — ABO/RH: ABO/RH(D): B POS

## 2016-08-03 MED ORDER — NIFEDIPINE 10 MG PO CAPS: 10.0000 mg | ORAL_CAPSULE | ORAL | Status: DC | PRN

## 2016-08-03 MED ORDER — LACTATED RINGERS IV BOLUS (SEPSIS)
1000.0000 mL | Freq: Once | INTRAVENOUS | Status: AC
  Administered 2016-08-03: 1000 mL via INTRAVENOUS

## 2016-08-03 MED ORDER — ACETAMINOPHEN 325 MG PO TABS: 650.0000 mg | ORAL_TABLET | ORAL | Status: DC | PRN

## 2016-08-03 MED ORDER — ZOLPIDEM TARTRATE 5 MG PO TABS: 5.0000 mg | ORAL_TABLET | Freq: Every evening | ORAL | Status: DC | PRN

## 2016-08-03 MED ORDER — DOCUSATE SODIUM 100 MG PO CAPS
100.0000 mg | ORAL_CAPSULE | Freq: Every day | ORAL | Status: DC
  Filled 2016-08-03 (×2): qty 1

## 2016-08-03 MED ORDER — LACTATED RINGERS IV SOLN
INTRAVENOUS | Status: DC
  Administered 2016-08-03 (×2): via INTRAVENOUS

## 2016-08-03 MED ORDER — BETAMETHASONE SOD PHOS & ACET 6 (3-3) MG/ML IJ SUSP
12.0000 mg | INTRAMUSCULAR | Status: DC
  Administered 2016-08-03: 12 mg via INTRAMUSCULAR
  Filled 2016-08-03 (×2): qty 2

## 2016-08-03 MED ORDER — CALCIUM CARBONATE ANTACID 500 MG PO CHEW: 2.0000 | CHEWABLE_TABLET | ORAL | Status: DC | PRN

## 2016-08-03 MED ORDER — PRENATAL MULTIVITAMIN CH
1.0000 | ORAL_TABLET | Freq: Every day | ORAL | Status: DC
  Filled 2016-08-03 (×2): qty 1

## 2016-08-03 NOTE — MAU Note (Signed)
Pt reprots she started feeling ctx this morning around 9am. Denies  vag bleeding or leaking. Decreased fetal movement since they started.

## 2016-08-03 NOTE — Discharge Summary (Signed)
Antenatal Physician Discharge Summary  Patient ID: Katelyn Fuentes MRN: [id number] DOB/AGE: 01-14-1991 26 y.o.  Admit date: 08/03/2016 Discharge date: 08/03/2016  Admission Diagnoses: preterm contractions  Discharge Diagnoses: same  Prenatal Procedures: NST   Significant Diagnostic Studies:  Results for orders placed or performed during the hospital encounter of 08/03/16 (from the past 168 hour(s))  Urinalysis, Routine w reflex microscopic   Collection Time: 08/03/16  9:55 AM  Result Value Ref Range   Color, Urine STRAW (A) YELLOW   APPearance CLEAR CLEAR   Specific Gravity, Urine 1.005 1.005 - 1.030   pH 7.0 5.0 - 8.0   Glucose, UA NEGATIVE NEGATIVE mg/dL   Hgb urine dipstick NEGATIVE NEGATIVE   Bilirubin Urine NEGATIVE NEGATIVE   Ketones, ur NEGATIVE NEGATIVE mg/dL   Protein, ur NEGATIVE NEGATIVE mg/dL   Nitrite NEGATIVE NEGATIVE   Leukocytes, UA LARGE (A) NEGATIVE   RBC / HPF 0-5 0 - 5 RBC/hpf   WBC, UA 6-30 0 - 5 WBC/hpf   Bacteria, UA MANY (A) NONE SEEN   Squamous Epithelial / LPF 0-5 (A) NONE SEEN  Fetal fibronectin   Collection Time: 08/03/16 11:20 AM  Result Value Ref Range   Fetal Fibronectin NEGATIVE NEGATIVE  Type and screen [facility]   Collection Time: 08/03/16  2:58 PM  Result Value Ref Range   ABO/RH(D) B POS    Antibody Screen NEG    Sample Expiration 08/06/2016   ABO/Rh   Collection Time: 08/03/16  3:00 PM  Result Value Ref Range   ABO/RH(D) B POS     Treatments: IV hydration  Hospital Course:  This is a 26 y.o. G2P1001 with IUP at [redacted]w[redacted]d admitted for preterm contractions. Pt was observed for an extended period of time in the MAU. She had no cervical change and a neg fFN but, she was having significant pain ful contraction so she was admitted for observation.  No leaking of fluid and no bleeding.  She received betamethasone x 1 dose.  She was not started on tocolysis due to lack of cervical change and her concomitant  medical problems,.  She was observed, fetal heart rate monitoring remained reassuring, and she had no signs/symptoms of progressing preterm labor or other maternal-fetal concerns.  Her cervix was not rechecked as her contraction abated once she was admitted. She reported tha tshe felt well enough ot go home. The toco showed no ctx for several hours prior to discharge. She was deemed stable for discharge to home with outpatient follow up.  Discharge Exam: BP 120/60 (BP Location: Right Arm)   Pulse 90   Temp 98.5 F (36.9 C) (Oral)   Resp 18   Ht  (1.676 m)   Wt 130 lb (59 kg)   LMP 01/18/2016 (Exact Date)   SpO2 99%   BMI 20.98 kg/m  General appearance: alert and mild distress  Discharge Condition: good  Disposition: 01-Home or Self Care  Discharge Instructions    Discharge activity:  No Restrictions    Complete by:  As directed    Discharge diet:  No restrictions    Complete by:  As directed    Do not have sex or do anything that might make you have an orgasm    Complete by:  As directed    Notify physician for a general feeling that "something is not right"    Complete by:  As directed    Notify physician for increase or change in vaginal discharge  Complete by:  As directed    Notify physician for intestinal cramps, with or without diarrhea, sometimes described as "gas pain"    Complete by:  As directed    Notify physician for leaking of fluid    Complete by:  As directed    Notify physician for low, dull backache, unrelieved by heat or Tylenol    Complete by:  As directed    Notify physician for menstrual like cramps    Complete by:  As directed    Notify physician for pelvic pressure    Complete by:  As directed    Notify physician for uterine contractions.  These may be painless and feel like the uterus is tightening or the baby is  "balling up"    Complete by:  As directed    Notify physician for vaginal bleeding    Complete by:  As directed    PRETERM LABOR:   Includes any of the follwing symptoms that occur between 20 - [redacted] weeks gestation.  If these symptoms are not stopped, preterm labor can result in preterm delivery, placing your baby at risk    Complete by:  As directed      Allergies as of 08/03/2016      Reactions   Peach Seed Hives   peaches      Medication List    STOP taking these medications   prenatal multivitamin Tabs tablet     TAKE these medications   calcium carbonate 500 MG chewable tablet Commonly known as:  TUMS - dosed in mg elemental calcium Chew 1-2 tablets by mouth at bedtime as needed for indigestion or heartburn.   Prenatal Adult Gummy/DHA/FA 0.4-25 MG Chew Chew 1 Dose by mouth daily.      Follow-up Information    [facility] Follow up in 2 week(s).   Contact information: [address] [place] [address] [phone]          Signed: Willodean Rosenthal M.D. 08/03/2016, 9:58 PM

## 2016-08-03 NOTE — H&P (Signed)
HPI: Katelyn Fuentes is a 26 y.o. G2P1001 admitted for signs of preterm labor.  She presented to MAU at [redacted]w[redacted]d pt of [facility] who presents to maternity admissions reporting onset of regular painful contractions at 8:45 am today. She reports the baby was moving well, more than usual, then movement stopped and then contractions started. She reports feeling movement now while in MAU but none for a couple of hours before she arrived.  The contractions are low, in her abdomen, and intermittent cramping pain that radiates to her low back.  There are no associated symptoms. She has tried drinking water, resting, changing positions, but nothing has helped.  Last intercourse was 2-3 days ago.  She has hx of one NSVD at 37 weeks, with no complications in this pregnancy other than maternal mitral valve prolapse that has not required treatment. She denies LOF, vaginal bleeding, vaginal itching/burning, urinary symptoms, h/a, dizziness, n/v, or fever/chills.     OB History    Gravida Para Term Preterm AB Living   SAB TAB Ectopic Multiple Live Births           1     Past Medical History:  Diagnosis Date  . Asthma    childhood  . Mitral valve prolapse   . SVT (supraventricular tachycardia) (HCC)    Past Surgical History:  Procedure Laterality Date  . NO PAST SURGERIES     Family History: family history includes Heart disease in her maternal grandmother and mother; Hypertension in her maternal grandmother and mother. Social History:  reports that she has never smoked. She has never used smokeless tobacco. She reports that she drinks alcohol. She reports that she does not use drugs.     Maternal Diabetes: No Genetic Screening: Normal Maternal Ultrasounds/Referrals: Normal Fetal Ultrasounds or other Referrals:  None Maternal Substance Abuse:  No Significant Maternal Medications:  None Significant Maternal Lab Results:  Lab values include: Other:  Other Comments:  GBS  unknown  Review of Systems  Constitutional: Negative for chills, fever and malaise/fatigue.  Eyes: Negative for blurred vision.  Respiratory: Negative for cough and shortness of breath.   Cardiovascular: Negative for chest pain.  Gastrointestinal: Positive for abdominal pain. Negative for heartburn and vomiting.  Genitourinary: Negative for dysuria, frequency and urgency.  Musculoskeletal: Negative.   Neurological: Negative for dizziness and headaches.  Psychiatric/Behavioral: Negative for depression.  All other systems reviewed and are negative.  Maternal Medical History:  Reason for admission: Contractions.   Contractions: Onset was 3-5 hours ago.   Frequency: regular.   Perceived severity is moderate.    Fetal activity: Perceived fetal activity is normal.   Last perceived fetal movement was within the past hour.    Prenatal complications: no prenatal complications Prenatal Complications - Diabetes: none.    Dilation: 1 Effacement (%): 30, 40 Exam by:: L.Leftwich-Kirby,CNM Blood pressure (!) 98/53, pulse 81, temperature 98.7 F (37.1 C), resp. rate 18, last menstrual period 01/18/2016. Maternal Exam:  Uterine Assessment: Contraction strength is mild.  Contraction duration is 60 seconds. Contraction frequency is regular.   Abdomen: Patient reports no abdominal tenderness. Fetal presentation: vertex  Cervix: Cervix evaluated by digital exam.     Fetal Exam Fetal Monitor Review: Mode: ultrasound.   Baseline rate: 135.  Variability: moderate (6-25 bpm).   Pattern: accelerations present and no decelerations.    Fetal State Assessment: Category I - tracings are normal.     Physical Exam  Nursing note and vitals reviewed. Constitutional: She is oriented to person, place, and time. She appears well-developed and well-nourished.  Neck: Normal range of motion.  Cardiovascular: Normal rate.   Respiratory: Effort normal.  GI: Soft.  Musculoskeletal: Normal range of  motion.  Neurological: She is alert and oriented to person, place, and time.  Skin: Skin is warm and dry.  Psychiatric: She has a normal mood and affect. Her behavior is normal. Judgment and thought content normal.    Prenatal labs: ABO, Rh: B/POS/-- (12/12 0857) Antibody: NEG (12/12 0857) Rubella: 4.79 (12/12 0857) RPR: NON REAC (12/12 0857)  HBsAg: NEGATIVE (12/12 0857)  HIV: NONREACTIVE (12/12 0857)  GBS:   Unknown  Assessment/Plan: G2P1001  1. Preterm labor in third trimester without delivery   2. Supervision of high risk pregnancy, antepartum   3. Mitral valve prolapse of mother during pregnancy   4. Ovarian cyst affecting pregnancy, antepartum   5. Mitral valve anterior leaflet prolapse    MDM: Consult Dr  Erin Fulling to discuss assessment and findings.  FFN negative and cervix unchanged from 1/40/-2 in 2 hours but pt uncomfortable with ctx Q 3 minutes.  Will admit for observation and give BMZ x 2.  Unable to give Procardia b/c pt BP 90s/40s, unable to give terbutaline r/t pt MVP and hx of SVT.  Urine culture pending.  Pt stable at time of transfer.  Admit for observation BMZ x 2 doses, Q 24 hours LR at 125 ml/hour Regular diet Q shift NST, continuous toco  Sharen Counter, CNM 2:53 PM    Sharen Counter 08/03/2016, 2:28 PM

## 2016-08-03 NOTE — Discharge Instructions (Signed)
Preterm Birth °Preterm birth is a birth that happens before 37 weeks of pregnancy. Most pregnancies last about 39-41 weeks. Every week in the womb is important and is beneficial to the health of the infant. Infants born before 37 weeks of pregnancy are at a higher risk for complications. Depending on when the infant was born, he or she may be: °· Late preterm. Born between 32 weeks and 37 weeks of pregnancy. °· Very preterm. Born at less than 32 weeks of pregnancy. °· Extremely preterm. Born at less than 25 weeks of pregnancy. °The earlier a baby is born, the more likely the child will have issues related to prematurity. Complications and problems that can be seen in infants born too early include: °· Problems breathing (respiratory distress syndrome). °· Low birth weight. °· Problems feeding. °· Sleeping problems. °· Yellowing of the skin (jaundice). °· Infections such as pneumonia. °Babies born very preterm or extremely preterm are at risk for more serious medical issues. These include: °· More severe breathing issues. °· Eyesight issues. °· Brain development issues (intraventricular hemorrhage). °· Behavioral and emotional development issues. °· Growth and developmental delays. °· Cerebral palsy. °· Serious feeding or bowel complications (necrotizing enterocolitis). °What are the causes? °There are two broad categories of preterm birth. °· Spontaneous preterm birth. This is a birth resulting from preterm labor (not medically induced) or preterm premature rupture of membranes (PPROM). °· Indicated preterm birth. This is a birth resulting from labor being medically induced due to health, personal, or social reasons. °What increases the risk? °Preterm birth may be related to certain medical conditions, lifestyle factors, or demographic factors encountered by the mother or fetus. °· Medical conditions include: °¨ Multiple gestations (twins, triplets, and so on). °¨ Infection. °¨ Diabetes. °¨ Heart disease. °¨ Kidney  disease. °¨ Cervical or uterine abnormalities. °¨ Being underweight. °¨ High blood pressure or preeclampsia. °¨ Premature rupture of membranes (PROM). °¨ Birth defects in the fetus. °· Lifestyle factors include: °¨ Poor prenatal care. °¨ Poor nutrition or anemia. °¨ Cigarette smoking. °¨ Consuming alcohol. °¨ High levels of stress and lack of social or emotional support. °¨ Exposure to chemical or environmental toxins. °¨ Substance abuse. °· Demographic factors include: °¨ African-American ethnicity. °¨ Age (younger than 18 or older than 25 years of age). °¨ Low socioeconomic status. °Women with a history of preterm labor or who become pregnant within 18 months of giving birth are also at increased risk for preterm birth. °How is this diagnosed? °Your health care provider may request additional tests to diagnose underlying complications resulting from preterm birth. Tests on the infant may include: °· Physical exam. °· Blood tests. °· Chest X-rays. °· Heart-lung monitoring. °How is this treated? °After birth, special care will be taken to assess any problems or complications for the infant. Supportive care will be provided for the infant. Treatment depends on what problems are present and any complications that develop. Some preterm infants are cared for in a neonatal intensive care unit. In general, care may include: °· Maintaining temperature and oxygen in a clear heated box (baby isolette). °· Monitoring the infant's heart rate, breathing, and level of oxygen in the blood. °· Monitoring for signs of infection and, if needed, giving IV antibiotic medicine. °· Inserting a feeding tube (nose, mouth) or giving IV nutrition if unable to feed. °· Inserting a breathing tube (ventilation). °· Respiration support (continuous positive airway pressure [CPAP] or oxygen). °Treatment will change as the infant builds up strength and is able   to breathe and eat on his or her own. For some infants, no special treatment is  necessary. Parents may be educated on the potential health risks of prematurity to the infant. °Follow these instructions at home: °· Understand your infant's special conditions and needs. It may be reassuring to learn about infant CPR. °· Monitor your infant in the car seat until he or she grows and matures. Infant car seats can cause breathing difficulties for preterm infants. °· Keep your infant warm. Dress your infant in layers and keep him or her away from drafts, especially in cold months of the year. °· Wash your hands thoroughly after going to the bathroom or changing a diaper. Late preterm infants may be more prone to infection. °· Follow all your health care provider's instructions for providing support and care to your preterm infant. °· Get support from organizations and groups that understand your challenges. °· Follow up with your infant's health care provider as directed. °Prevention  °There are some things you can do to help lower your risk of having a preterm infant in the future. These include: °· Good prenatal care throughout the entire pregnancy. See a health care provider regularly for advice and tests. °· Management of underlying medical conditions. °· Proper self-care and lifestyle changes. °· Proper diet and weight control. °· Watching for signs of various infections. °Where to find more information: °March of [employer]: www.marchofdimes.com °Prematurity.org: www.prematurity.org °Contact a health care provider if: °· Your infant has feeding difficulties. °· Your infant has sleeping difficulties. °· Your infant has breathing difficulties. °· Your infant's skin starts to look yellow. °· Your infant shows signs of infection, such as a stuffy nose, fever, crying, or bluish color of the skin. °This information is not intended to replace advice given to you by your health care provider. Make sure you discuss any questions you have with your health care provider. °Document Released: 07/08/2003 Document  Revised: 09/23/2015 Document Reviewed: 11/14/2012 °Elsevier Interactive Patient Education © 2017 [employer] ° °

## 2016-08-03 NOTE — Progress Notes (Signed)
Pt ambulated off unit for discharge with significant other. No further concerns at this time.

## 2016-08-04 ENCOUNTER — Inpatient Hospital Stay (HOSPITAL_COMMUNITY)
Admission: AD | Admit: 2016-08-04 | Discharge: 2016-08-04 | Disposition: A | Discharge status: Home / Self Care | Payer: Medicaid Other | Source: Ambulatory Visit | Attending: Obstetrics & Gynecology | Admitting: Obstetrics & Gynecology

## 2016-08-04 ENCOUNTER — Encounter (HOSPITAL_COMMUNITY): Payer: Self-pay | Admitting: *Deleted

## 2016-08-04 DIAGNOSIS — J45909 Unspecified asthma, uncomplicated: Secondary | ICD-10-CM | POA: Insufficient documentation

## 2016-08-04 DIAGNOSIS — Z888 Allergy status to other drugs, medicaments and biological substances status: Secondary | ICD-10-CM | POA: Insufficient documentation

## 2016-08-04 DIAGNOSIS — O99413 Diseases of the circulatory system complicating pregnancy, third trimester: Secondary | ICD-10-CM | POA: Diagnosis present

## 2016-08-04 DIAGNOSIS — Z8249 Family history of ischemic heart disease and other diseases of the circulatory system: Secondary | ICD-10-CM | POA: Insufficient documentation

## 2016-08-04 DIAGNOSIS — O4700 False labor before 37 completed weeks of gestation, unspecified trimester: Secondary | ICD-10-CM

## 2016-08-04 DIAGNOSIS — Z79899 Other long term (current) drug therapy: Secondary | ICD-10-CM | POA: Diagnosis not present

## 2016-08-04 DIAGNOSIS — O99513 Diseases of the respiratory system complicating pregnancy, third trimester: Secondary | ICD-10-CM | POA: Insufficient documentation

## 2016-08-04 DIAGNOSIS — O479 False labor, unspecified: Secondary | ICD-10-CM

## 2016-08-04 DIAGNOSIS — N83209 Unspecified ovarian cyst, unspecified side: Secondary | ICD-10-CM

## 2016-08-04 DIAGNOSIS — O3480 Maternal care for other abnormalities of pelvic organs, unspecified trimester: Secondary | ICD-10-CM

## 2016-08-04 DIAGNOSIS — O0990 Supervision of high risk pregnancy, unspecified, unspecified trimester: Secondary | ICD-10-CM

## 2016-08-04 DIAGNOSIS — I471 Supraventricular tachycardia: Secondary | ICD-10-CM | POA: Diagnosis not present

## 2016-08-04 DIAGNOSIS — Z3A28 28 weeks gestation of pregnancy: Secondary | ICD-10-CM | POA: Insufficient documentation

## 2016-08-04 DIAGNOSIS — I341 Nonrheumatic mitral (valve) prolapse: Secondary | ICD-10-CM | POA: Diagnosis not present

## 2016-08-04 DIAGNOSIS — O99419 Diseases of the circulatory system complicating pregnancy, unspecified trimester: Secondary | ICD-10-CM

## 2016-08-04 LAB — CULTURE, OB URINE: CULTURE: NO GROWTH

## 2016-08-04 MED ORDER — BETAMETHASONE SOD PHOS & ACET 6 (3-3) MG/ML IJ SUSP
12.0000 mg | Freq: Once | INTRAMUSCULAR | Status: AC
  Administered 2016-08-04: 12 mg via INTRAMUSCULAR
  Filled 2016-08-04: qty 2

## 2016-08-04 NOTE — MAU Provider Note (Signed)
History     CSN: [id number]  Arrival date and time: 08/04/16 1403   First Provider Initiated Contact with Patient 08/04/16 1500      No chief complaint on file.  Patient is a 26 y/o G2P1 at 28 wks. She was seen yesterday with contractions had a negative FFN, but was admitted because she was uncomfortable. She received IVF, but was not given procradia due to history of mitral valve prolapse. She had no contractions on the monitor for several hours and so was discharged home. When she returned to the MAU for her second betamethasone injection she reports ongoing contractions. She reports they are not as frequent as yesterday, but are potentially stronger.     OB History    Gravida Para Term Preterm AB Living   SAB TAB Ectopic Multiple Live Births           1      Past Medical History:  Diagnosis Date  . Asthma    childhood  . Mitral valve prolapse   . SVT (supraventricular tachycardia) (HCC)     Past Surgical History:  Procedure Laterality Date  . NO PAST SURGERIES      Family History  Problem Relation Age of Onset  . Heart disease Mother   . Hypertension Mother   . Heart disease Maternal Grandmother   . Hypertension Maternal Grandmother   . Diabetes Neg Hx   . Heart failure Neg Hx   . Hyperlipidemia Neg Hx     Social History  Substance Use Topics  . Smoking status: Never Smoker  . Smokeless tobacco: Never Used  . Alcohol use Yes     Comment: occasional    Allergies:  Allergies  Allergen Reactions  . Peach Seed Hives    peaches    Prescriptions Prior to Admission  Medication Sig Dispense Refill Last Dose  . calcium carbonate (TUMS - DOSED IN MG ELEMENTAL CALCIUM) 500 MG chewable tablet Chew 1-2 tablets by mouth at bedtime as needed for indigestion or heartburn.   08/01/2016  . Prenatal MV & Min w/FA-DHA (PRENATAL ADULT GUMMY/DHA/FA) 0.4-25 MG CHEW Chew 1 Dose by mouth daily. 90 tablet 1 07/20/2016 at Unknown time    Review of Systems   Constitutional: Negative for chills and fever.  HENT: Negative for congestion and rhinorrhea.   Respiratory: Negative for cough and shortness of breath.   Cardiovascular: Negative for chest pain and palpitations.  Gastrointestinal: Positive for abdominal pain. Negative for abdominal distention, constipation, diarrhea, nausea and vomiting.  Genitourinary: Negative for difficulty urinating, dysuria and frequency.  Musculoskeletal: Negative for arthralgias and back pain.  Neurological: Negative for dizziness and weakness.   Physical Exam   Blood pressure 103/62, pulse 90, temperature 98.3 F (36.8 C), temperature source Oral, resp. rate 16, last menstrual period 01/18/2016.  Physical Exam  Constitutional: She is oriented to person, place, and time. She appears well-developed and well-nourished.  HENT:  Head: Normocephalic and atraumatic.  Cardiovascular: Normal rate and intact distal pulses.   Respiratory: Effort normal. No respiratory distress.  GI: Soft. Bowel sounds are normal. She exhibits no distension. There is no tenderness. There is no rebound and no guarding.  Genitourinary:  Genitourinary Comments: Cervix closed thick and high, on 2 exams 1 hour apart. No vaginal bleeding noted on glovesignificant vaginal discharge noted.  Musculoskeletal: Normal range of motion. She exhibits no edema.  Neurological: She is alert and oriented to person, place, and  time. No cranial nerve deficit.  Skin: Skin is warm and dry.  Psychiatric: She has a normal mood and affect.    MAU Course  Procedures  MDM In MAU previous admission was reviewed. It was noted that patient has a negative urine culture that was collected at last admission. Additionally she had an FFN that was negative prior to recent admission. It was not repeated as she has been checked last 24 hours.   Patient was monitored for an hour and did have some uterine irritability on the monitor but no regular contraction pattern.  After by mouth hydration patient reported she felt significantly better.  Assessment and Plan  #1:: Preterm contractions. Continue to encourage by mouth hydration. Return with heavy vaginal bleeding or increased contractions. Received second dose of betamethasone today.  Ernestina Penna 08/04/2016, 3:19 PM

## 2016-08-04 NOTE — Discharge Instructions (Signed)
Preventing Preterm Birth Preterm birth is when your baby is delivered between 72 weeks and 37 weeks of pregnancy. A full-term pregnancy lasts for at least 37 weeks. Preterm birth can be dangerous for your baby because the last few weeks of pregnancy are an important time for your baby's brain and lungs to grow. Many things can cause a baby to be born early. Sometimes the cause is not known. There are certain factors that make you more likely to experience preterm birth, such as:  Having a previous baby born preterm.  Being pregnant with twins or other multiples.  Having had fertility treatment.  Being overweight or underweight at the start of your pregnancy.  Having any of the following during pregnancy:  An infection, including a urinary tract infection (UTI) or an STI (sexually transmitted infection).  High blood pressure.  Diabetes.  Vaginal bleeding.  Being age 12 or older.  Being age 81 or younger.  Getting pregnant within 6 months of a previous pregnancy.  Suffering extreme stress or physical or emotional abuse during pregnancy.  Standing for long periods of time during pregnancy, such as working at a job that requires standing. What are the risks? The most serious risk of preterm birth is that the baby may not survive. This is more likely to happen if a baby is born before 71 weeks. Other risks and complications of preterm birth may include your baby having:  Breathing problems.  Brain damage that affects movement and coordination (cerebral palsy).  Feeding difficulties.  Vision or hearing problems.  Infections or inflammation of the digestive tract (colitis).  Developmental delays.  Learning disabilities.  Higher risk for diabetes, heart disease, and high blood pressure later in life. What can I do to lower my risk? Medical care  The most important thing you can do to lower your risk for preterm birth is to get routine medical care during pregnancy (prenatal  care). If you have a high risk of preterm birth, you may be referred to a health care provider who specializes in managing high-risk pregnancies (perinatologist). You may be given medicine to help prevent preterm birth. Lifestyle changes  Certain lifestyle changes can also lower your risk of preterm birth:  Wait at least 6 months after a pregnancy to become pregnant again.  Try to plan pregnancy for when you are between 51 and 18 years old.  Get to a healthy weight before getting pregnant. If you are overweight, work with your health care provider to safely lose weight.  Do not use any products that contain nicotine or tobacco, such as cigarettes and e-cigarettes. If you need help quitting, ask your health care provider.  Do not drink alcohol.  Do not use drugs. Where to find support: For more support, consider:  Talking with your health care provider.  Talking with a therapist or substance abuse counselor, if you need help quitting.  Working with a diet and nutrition specialist (dietitian) or a Physiological scientist to maintain a healthy weight.  Joining a support group. Where to find more information: Learn more about preventing preterm birth from:  Centers for Disease Control and Prevention: VoipObserver.com.br  March of Dimes: marchofdimes.org/complications/premature-babies.aspx  American Pregnancy Association: americanpregnancy.org/labor-and-birth/premature-labor Contact a health care provider if:  You have any of the following signs of preterm labor before 37 weeks:  A change or increase in vaginal discharge.  Fluid leaking from your vagina.  Pressure or cramps in your lower abdomen.  A backache that does not go away or gets  worse.  Regular tightening (contractions) in your lower abdomen. Summary  Preterm birth means having your baby during weeks 20-37 of pregnancy.  Preterm birth may put your baby at risk for physical  and mental problems.  Getting good prenatal care can help prevent preterm birth.  You can lower your risk of preterm birth by making certain lifestyle changes, such as not smoking and not using alcohol. This information is not intended to replace advice given to you by your health care provider. Make sure you discuss any questions you have with your health care provider. Document Released: 06/01/2015 Document Revised: 12/25/2015 Document Reviewed: 12/25/2015 Elsevier Interactive Patient Education  2017 [employer] of the uterus can occur throughout pregnancy, but they are not always a sign that you are in labor. You may have practice contractions called Braxton Hicks contractions. These false labor contractions are sometimes confused with true labor. What are Deberah Pelton contractions? Braxton Hicks contractions are tightening movements that occur in the muscles of the uterus before labor. Unlike true labor contractions, these contractions do not result in opening (dilation) and thinning of the cervix. Toward the end of pregnancy (32-34 weeks), Braxton Hicks contractions can happen more often and may become stronger. These contractions are sometimes difficult to tell apart from true labor because they can be very uncomfortable. You should not feel embarrassed if you go to the hospital with false labor. Sometimes, the only way to tell if you are in true labor is for your health care provider to look for changes in the cervix. The health care provider will do a physical exam and may monitor your contractions. If you are not in true labor, the exam should show that your cervix is not dilating and your water has not broken. If there are no prenatal problems or other health problems associated with your pregnancy, it is completely safe for you to be sent home with false labor. You may continue to have Braxton Hicks contractions until you go into true labor. How  can I tell the difference between true labor and false labor?  Differences  False labor  Contractions last 30-70 seconds.: Contractions are usually shorter and not as strong as true labor contractions.  Contractions become very regular.: Contractions are usually irregular.  Discomfort is usually felt in the top of the uterus, and it spreads to the lower abdomen and low back.: Contractions are often felt in the front of the lower abdomen and in the groin.  Contractions do not go away with walking.: Contractions may go away when you walk around or change positions while lying down.  Contractions usually become more intense and increase in frequency.: Contractions get weaker and are shorter-lasting as time goes on.  The cervix dilates and gets thinner.: The cervix usually does not dilate or become thin. Follow these instructions at home:  Take over-the-counter and prescription medicines only as told by your health care provider.  Keep up with your usual exercises and follow other instructions from your health care provider.  Eat and drink lightly if you think you are going into labor.  If Braxton Hicks contractions are making you uncomfortable:  Change your position from lying down or resting to walking, or change from walking to resting.  Sit and rest in a tub of warm water.  Drink enough fluid to keep your urine clear or pale yellow. Dehydration may cause these contractions.  Do slow and deep breathing several times an hour.  Keep all follow-up  prenatal visits as told by your health care provider. This is important. Contact a health care provider if:  You have a fever.  You have continuous pain in your abdomen. Get help right away if:  Your contractions become stronger, more regular, and closer together.  You have fluid leaking or gushing from your vagina.  You pass blood-tinged mucus (bloody show).  You have bleeding from your vagina.  You have low back pain that  you never had before.  You feel your babys head pushing down and causing pelvic pressure.  Your baby is not moving inside you as much as it used to. Summary  Contractions that occur before labor are called Braxton Hicks contractions, false labor, or practice contractions.  Braxton Hicks contractions are usually shorter, weaker, farther apart, and less regular than true labor contractions. True labor contractions usually become progressively stronger and regular and they become more frequent.  Manage discomfort from [facility] contractions by changing position, resting in a warm bath, drinking plenty of water, or practicing deep breathing. This information is not intended to replace advice given to you by your health care provider. Make sure you discuss any questions you have with your health care provider. Document Released: 04/17/2005 Document Revised: 03/06/2016 Document Reviewed: 03/06/2016 Elsevier Interactive Patient Education  2017 [employer].

## 2016-08-14 ENCOUNTER — Encounter: Payer: Medicaid Other | Admitting: Family Medicine

## 2016-08-18 ENCOUNTER — Inpatient Hospital Stay (HOSPITAL_COMMUNITY)
Admission: AD | Admit: 2016-08-18 | Discharge: 2016-08-19 | Disposition: A | Discharge status: Home / Self Care | Payer: Medicaid Other | Source: Ambulatory Visit | Attending: Family Medicine | Admitting: Family Medicine

## 2016-08-18 ENCOUNTER — Encounter (HOSPITAL_COMMUNITY): Payer: Self-pay | Admitting: *Deleted

## 2016-08-18 DIAGNOSIS — O99413 Diseases of the circulatory system complicating pregnancy, third trimester: Secondary | ICD-10-CM | POA: Diagnosis not present

## 2016-08-18 DIAGNOSIS — O4703 False labor before 37 completed weeks of gestation, third trimester: Secondary | ICD-10-CM

## 2016-08-18 DIAGNOSIS — Z3A30 30 weeks gestation of pregnancy: Secondary | ICD-10-CM | POA: Diagnosis not present

## 2016-08-18 DIAGNOSIS — Z888 Allergy status to other drugs, medicaments and biological substances status: Secondary | ICD-10-CM | POA: Diagnosis not present

## 2016-08-18 DIAGNOSIS — O99613 Diseases of the digestive system complicating pregnancy, third trimester: Secondary | ICD-10-CM | POA: Diagnosis not present

## 2016-08-18 DIAGNOSIS — O99419 Diseases of the circulatory system complicating pregnancy, unspecified trimester: Secondary | ICD-10-CM

## 2016-08-18 DIAGNOSIS — O26893 Other specified pregnancy related conditions, third trimester: Secondary | ICD-10-CM | POA: Diagnosis present

## 2016-08-18 DIAGNOSIS — R109 Unspecified abdominal pain: Secondary | ICD-10-CM

## 2016-08-18 DIAGNOSIS — K5900 Constipation, unspecified: Secondary | ICD-10-CM

## 2016-08-18 DIAGNOSIS — I341 Nonrheumatic mitral (valve) prolapse: Secondary | ICD-10-CM | POA: Diagnosis not present

## 2016-08-18 DIAGNOSIS — Z8249 Family history of ischemic heart disease and other diseases of the circulatory system: Secondary | ICD-10-CM | POA: Diagnosis not present

## 2016-08-18 DIAGNOSIS — O99513 Diseases of the respiratory system complicating pregnancy, third trimester: Secondary | ICD-10-CM | POA: Diagnosis not present

## 2016-08-18 DIAGNOSIS — O0990 Supervision of high risk pregnancy, unspecified, unspecified trimester: Secondary | ICD-10-CM

## 2016-08-18 DIAGNOSIS — N83209 Unspecified ovarian cyst, unspecified side: Secondary | ICD-10-CM

## 2016-08-18 DIAGNOSIS — Z79899 Other long term (current) drug therapy: Secondary | ICD-10-CM | POA: Diagnosis not present

## 2016-08-18 DIAGNOSIS — R10823 Right lower quadrant rebound abdominal tenderness: Secondary | ICD-10-CM

## 2016-08-18 DIAGNOSIS — J45909 Unspecified asthma, uncomplicated: Secondary | ICD-10-CM | POA: Insufficient documentation

## 2016-08-18 DIAGNOSIS — Z833 Family history of diabetes mellitus: Secondary | ICD-10-CM | POA: Diagnosis not present

## 2016-08-18 DIAGNOSIS — R1030 Lower abdominal pain, unspecified: Secondary | ICD-10-CM | POA: Diagnosis not present

## 2016-08-18 DIAGNOSIS — K5909 Other constipation: Secondary | ICD-10-CM | POA: Insufficient documentation

## 2016-08-18 DIAGNOSIS — I471 Supraventricular tachycardia: Secondary | ICD-10-CM | POA: Diagnosis not present

## 2016-08-18 DIAGNOSIS — R630 Anorexia: Secondary | ICD-10-CM | POA: Diagnosis not present

## 2016-08-18 DIAGNOSIS — O9989 Other specified diseases and conditions complicating pregnancy, childbirth and the puerperium: Secondary | ICD-10-CM | POA: Diagnosis not present

## 2016-08-18 DIAGNOSIS — O3480 Maternal care for other abnormalities of pelvic organs, unspecified trimester: Secondary | ICD-10-CM

## 2016-08-18 DIAGNOSIS — R110 Nausea: Secondary | ICD-10-CM

## 2016-08-18 LAB — URINALYSIS, ROUTINE W REFLEX MICROSCOPIC
BILIRUBIN URINE: NEGATIVE
Glucose, UA: NEGATIVE mg/dL
Hgb urine dipstick: NEGATIVE
Ketones, ur: NEGATIVE mg/dL
NITRITE: NEGATIVE
PROTEIN: NEGATIVE mg/dL
SPECIFIC GRAVITY, URINE: 1.006 (ref 1.005–1.030)
pH: 6 (ref 5.0–8.0)

## 2016-08-18 NOTE — MAU Note (Signed)
Contractions since 0700

## 2016-08-18 NOTE — MAU Note (Signed)
Urine sent to lab 

## 2016-08-19 ENCOUNTER — Inpatient Hospital Stay (HOSPITAL_COMMUNITY): Payer: Medicaid Other

## 2016-08-19 ENCOUNTER — Encounter (HOSPITAL_COMMUNITY): Payer: Self-pay | Admitting: Radiology

## 2016-08-19 DIAGNOSIS — O99613 Diseases of the digestive system complicating pregnancy, third trimester: Secondary | ICD-10-CM

## 2016-08-19 DIAGNOSIS — R630 Anorexia: Secondary | ICD-10-CM

## 2016-08-19 DIAGNOSIS — O9989 Other specified diseases and conditions complicating pregnancy, childbirth and the puerperium: Secondary | ICD-10-CM | POA: Diagnosis not present

## 2016-08-19 DIAGNOSIS — K5900 Constipation, unspecified: Secondary | ICD-10-CM

## 2016-08-19 DIAGNOSIS — R109 Unspecified abdominal pain: Secondary | ICD-10-CM

## 2016-08-19 DIAGNOSIS — R10823 Right lower quadrant rebound abdominal tenderness: Secondary | ICD-10-CM

## 2016-08-19 DIAGNOSIS — O4703 False labor before 37 completed weeks of gestation, third trimester: Secondary | ICD-10-CM

## 2016-08-19 DIAGNOSIS — R110 Nausea: Secondary | ICD-10-CM

## 2016-08-19 LAB — CBC WITH DIFFERENTIAL/PLATELET
BASOS ABS: 0 10*3/uL (ref 0.0–0.1)
Basophils Relative: 0 %
EOS PCT: 1 %
Eosinophils Absolute: 0.1 10*3/uL (ref 0.0–0.7)
HEMATOCRIT: 26.2 % — AB (ref 36.0–46.0)
Hemoglobin: 9.1 g/dL — ABNORMAL LOW (ref 12.0–15.0)
LYMPHS ABS: 2.3 10*3/uL (ref 0.7–4.0)
Lymphocytes Relative: 27 %
MCH: 28.2 pg (ref 26.0–34.0)
MCHC: 34.7 g/dL (ref 30.0–36.0)
MCV: 81.1 fL (ref 78.0–100.0)
MONO ABS: 0.4 10*3/uL (ref 0.1–1.0)
MONOS PCT: 5 %
NEUTROS ABS: 5.6 10*3/uL (ref 1.7–7.7)
Neutrophils Relative %: 67 %
Platelets: 216 10*3/uL (ref 150–400)
RBC: 3.23 MIL/uL — ABNORMAL LOW (ref 3.87–5.11)
RDW: 13.5 % (ref 11.5–15.5)
WBC: 8.4 10*3/uL (ref 4.0–10.5)

## 2016-08-19 LAB — WET PREP, GENITAL
Clue Cells Wet Prep HPF POC: NONE SEEN
Sperm: NONE SEEN
TRICH WET PREP: NONE SEEN
YEAST WET PREP: NONE SEEN

## 2016-08-19 LAB — FETAL FIBRONECTIN: FETAL FIBRONECTIN: NEGATIVE

## 2016-08-19 MED ORDER — LACTATED RINGERS IV BOLUS (SEPSIS)
1000.0000 mL | Freq: Once | INTRAVENOUS | Status: AC
  Administered 2016-08-19: 1000 mL via INTRAVENOUS

## 2016-08-19 MED ORDER — IOPAMIDOL (ISOVUE-300) INJECTION 61%
100.0000 mL | Freq: Once | INTRAVENOUS | Status: AC | PRN
  Administered 2016-08-19: 100 mL via INTRAVENOUS

## 2016-08-19 MED ORDER — ACETAMINOPHEN 500 MG PO TABS
1000.0000 mg | ORAL_TABLET | Freq: Once | ORAL | Status: AC
  Administered 2016-08-19: 1000 mg via ORAL
  Filled 2016-08-19: qty 2

## 2016-08-19 MED ORDER — ONDANSETRON HCL 4 MG/2ML IJ SOLN
4.0000 mg | Freq: Once | INTRAMUSCULAR | Status: DC | PRN
Start: 2016-08-19 — End: 2016-08-19

## 2016-08-19 MED ORDER — POLYETHYLENE GLYCOL 3350 17 GM/SCOOP PO POWD: 17.0000 g | Freq: Every day | ORAL | 2 refills | Status: DC | PRN

## 2016-08-19 NOTE — MAU Provider Note (Signed)
Chief Complaint:  No chief complaint on file.   First Provider Initiated Contact with Patient 08/18/16 2357     HPI: Katelyn Fuentes is a 26 y.o. G2P1001 at [redacted]w[redacted]d who presents to maternity admissions reporting right sided abd pain and low abd pain. The right sided abd pain is new--started yesterday morning 08/18/16-- and is her primary concern. She also reports new-onset nausea and decreased appetite since right sided pain started.   Has chronic constipation.   She was Observed  at [facility] 08/03/16 and received BMZ x 2 for preterm contractions. Cervix was 1/long. No change in UC's.   Location: right lateral abd at level of umbilicus.  Quality: sharp Severity: 8/10 in pain scale Duration: ~18 hours Context: None Timing: constant Modifying factors: No improvement w/ ES Tylenol, position changes, PO hydration.  Associated signs and symptoms: Pos for nausea, decreased appetite. Neg for fever chills, vomiting, diarrhea, vaginal bleeding vaginal discharge.   Location: suprapubic Quality: sharp, sometimes cramping Severity: 7/10 in pain scale Duration: 2 weeks Context: None Timing: Intermittent Modifying factors: Improves w/ rest Associated signs and symptoms: Neg for VB, LOF, urinary complaints. Pos for nausea, decreased appetite, constipation.  Denies contractions, leakage of fluid or vaginal bleeding. Good fetal movement.    Past Medical History:  Diagnosis Date  . Asthma    childhood  . Mitral valve prolapse   . SVT (supraventricular tachycardia) (HCC)    OB History  Gravida Para Term Preterm AB Living  SAB TAB Ectopic Multiple Live Births          1    # Outcome Date GA Lbr Len/2nd Weight Sex Delivery Anes PTL Lv  2 Current           1 Term 04/12/11 [redacted]w[redacted]d 08:08 / 00:57 6 lb 15.6 oz (3.165 kg) M Vag-Spont EPI  LIV     Past Surgical History:  Procedure Laterality Date  . NO PAST SURGERIES     Family History  Problem Relation Age of Onset  . Heart  disease Mother   . Hypertension Mother   . Heart disease Maternal Grandmother   . Hypertension Maternal Grandmother   . Diabetes Neg Hx   . Heart failure Neg Hx   . Hyperlipidemia Neg Hx    Social History  Substance Use Topics  . Smoking status: Never Smoker  . Smokeless tobacco: Never Used  . Alcohol use Yes     Comment: occasional   Allergies  Allergen Reactions  . Peach Seed Hives    peaches   Prescriptions Prior to Admission  Medication Sig Dispense Refill Last Dose  . calcium carbonate (TUMS - DOSED IN MG ELEMENTAL CALCIUM) 500 MG chewable tablet Chew 1-2 tablets by mouth at bedtime as needed for indigestion or heartburn.   08/01/2016  . Prenatal MV & Min w/FA-DHA (PRENATAL ADULT GUMMY/DHA/FA) 0.4-25 MG CHEW Chew 1 Dose by mouth daily. 90 tablet 1 07/20/2016 at Unknown time    I have reviewed patient's Past Medical Hx, Surgical Hx, Family Hx, Social Hx, medications and allergies.   ROS:  Review of Systems  Constitutional: Positive for appetite change and fatigue. Negative for chills and fever.  Gastrointestinal: Positive for abdominal pain, constipation and nausea. Negative for abdominal distention, blood in stool, diarrhea and vomiting.  Genitourinary: Negative for dysuria, flank pain, frequency, hematuria, vaginal bleeding and vaginal discharge.  Musculoskeletal: Negative for back pain.    Physical Exam   Patient Vitals  for the past 24 hrs:  BP Temp Temp src Pulse Resp Height Weight  08/19/16 0332 (!) 97/51 98.3 F (36.8 C) Oral 77 17 - -  08/19/16 0216 (!) 100/48 98.2 F (36.8 C) Oral 77 17 - -  08/18/16 2354 (!) 103/53 98.3 F (36.8 C) Oral 80 17 - -  08/18/16 2228 (!) 99/59 98.2 F (36.8 C) Oral 84 16  (1.676 m) 135 lb (61.2 kg)   Constitutional: Well-developed, well-nourished female in mild distress.  Cardiovascular: normal rate Respiratory: normal effort GI: Abd mildly distended, mod to severe TTP and pos rebound tenderness right lateral abd at level  of umbilicus. No palpable mass. Mild SP tenderness. Gravid, appropriate for gestational age. Pos BS x 4 MS: Extremities nontender, no edema, normal ROM Neurologic: Alert and oriented x 4.  GU: Neg CVAT.  Pelvic: NEFG, physiologic discharge, no blood, cervix clean. No CMT  Dilation: 1 Effacement (%): 30 Cervical Position: Posterior Station: -3 Exam by:: [place], CNM  FHT:  Baseline 135 , moderate variability, accelerations present, no decelerations Contractions: Irreg initially, but became more frequent--Q2-6 minutes. (Same as when pt was on monitor during previous hospital visit.    Labs: Results for orders placed or performed during the hospital encounter of 08/18/16 (from the past 24 hour(s))  Urinalysis, Routine w reflex microscopic     Status: Abnormal   Collection Time: 08/18/16 10:45 PM  Result Value Ref Range   Color, Urine YELLOW YELLOW   APPearance HAZY (A) CLEAR   Specific Gravity, Urine 1.006 1.005 - 1.030   pH 6.0 5.0 - 8.0   Glucose, UA NEGATIVE NEGATIVE mg/dL   Hgb urine dipstick NEGATIVE NEGATIVE   Bilirubin Urine NEGATIVE NEGATIVE   Ketones, ur NEGATIVE NEGATIVE mg/dL   Protein, ur NEGATIVE NEGATIVE mg/dL   Nitrite NEGATIVE NEGATIVE   Leukocytes, UA MODERATE (A) NEGATIVE   RBC / HPF 0-5 0 - 5 RBC/hpf   WBC, UA 6-30 0 - 5 WBC/hpf   Bacteria, UA MANY (A) NONE SEEN   Squamous Epithelial / LPF 0-5 (A) NONE SEEN   Mucous PRESENT   Fetal fibronectin     Status: None   Collection Time: 08/18/16 11:30 PM  Result Value Ref Range   Fetal Fibronectin NEGATIVE NEGATIVE  Wet prep, genital     Status: Abnormal   Collection Time: 08/18/16 11:30 PM  Result Value Ref Range   Yeast Wet Prep HPF POC NONE SEEN NONE SEEN   Trich, Wet Prep NONE SEEN NONE SEEN   Clue Cells Wet Prep HPF POC NONE SEEN NONE SEEN   WBC, Wet Prep HPF POC FEW (A) NONE SEEN   Sperm NONE SEEN   CBC with Differential/Platelet     Status: Abnormal   Collection Time: 08/19/16 12:14 AM  Result  Value Ref Range   WBC 8.4 4.0 - 10.5 K/uL   RBC 3.23 (L) 3.87 - 5.11 MIL/uL   Hemoglobin 9.1 (L) 12.0 - 15.0 g/dL   HCT 16.1 (L) 09.6 - 04.5 %   MCV 81.1 78.0 - 100.0 fL   MCH 28.2 26.0 - 34.0 pg   MCHC 34.7 30.0 - 36.0 g/dL   RDW 40.9 81.1 - 91.4 %   Platelets 216 150 - 400 K/uL   Neutrophils Relative % 67 %   Neutro Abs 5.6 1.7 - 7.7 K/uL   Lymphocytes Relative 27 %   Lymphs Abs 2.3 0.7 - 4.0 K/uL   Monocytes Relative 5 %   Monocytes Absolute  0.4 0.1 - 1.0 K/uL   Eosinophils Relative 1 %   Eosinophils Absolute 0.1 0.0 - 0.7 K/uL   Basophils Relative 0 %   Basophils Absolute 0.0 0.0 - 0.1 K/uL    Imaging:  Ct Abdomen Pelvis W Contrast  Result Date: 08/19/2016 CLINICAL DATA:  Right-sided pain, [redacted] weeks pregnant EXAM: CT ABDOMEN AND PELVIS WITH CONTRAST TECHNIQUE: Multidetector CT imaging of the abdomen and pelvis was performed using the standard protocol following bolus administration of intravenous contrast. CONTRAST:  ISOVUE-300 IOPAMIDOL (ISOVUE-300) INJECTION 61% COMPARISON:  None. FINDINGS: Lower chest: Lung bases demonstrate no acute infiltrate or effusion. Normal heart size. Hepatobiliary: No focal liver abnormality is seen. No gallstones, gallbladder wall thickening, or biliary dilatation. Contracted gallbladder Pancreas: Unremarkable. No pancreatic ductal dilatation or surrounding inflammatory changes. Spleen: Normal in size without focal abnormality. Adrenals/Urinary Tract: Adrenal glands are within normal limits. Mild to moderate right hydronephrosis. No calcified stones. No ureteral calculi. Bladder normal. Stomach/Bowel: Stomach is within normal limits. Appendix appears normal. No evidence of bowel wall thickening, distention, or inflammatory changes. Vascular/Lymphatic: No significant vascular findings are present. No enlarged abdominal or pelvic lymph nodes. Reproductive: Single intrauterine gestation with vertex down. No adnexal masses. Other: No free air or free fluid.  Musculoskeletal: No acute or suspicious bone lesion IMPRESSION: 1. Normal CT appearance of the appendix, no evidence for appendicitis. No right lower quadrant inflammatory process is visualized. 2. Mild to moderate hydronephrosis of the right kidney, suspect secondary to pregnancy status. No ureteral stones visualized. Electronically Signed   By: Jasmine Pang M.D.   On: 08/19/2016 03:05    MAU Course: Orders Placed This Encounter  Procedures  . Wet prep, genital  . Culture, OB Urine  . CT ABDOMEN PELVIS W CONTRAST  . Urinalysis, Routine w reflex microscopic  . Fetal fibronectin  . CBC with Differential/Platelet  . Diet NPO time specified  . Insert peripheral IV  . Discharge patient   Meds ordered this encounter  Medications  . acetaminophen (TYLENOL) tablet 1,000 mg  . lactated ringers bolus 1,000 mL  . ondansetron (ZOFRAN) injection 4 mg  . iopamidol (ISOVUE-300) 61 % injection 100 mL  . polyethylene glycol powder (GLYCOLAX/MIRALAX) powder    Sig: Take 17 g by mouth daily as needed for moderate constipation.    Dispense:  500 g    Refill:  2    Order Specific Question:   Supervising Provider    Answer:   Reva Bores [[id number]]    MDM: - Preterm contractions w/out further cervical change and neg fFN. Has already received BMZ 4/5 and 4/6. Stable. - Right abd pain likely from bowel gas and constipation. CT Nml w/out evidence of appendicitis or other emergent condition.   Assessment: 1. Constipation during pregnancy in third trimester   2. Anorexia   3. Nausea   4. Right lateral abdominal pain   5. [redacted] weeks gestation of pregnancy   6. Right lower quadrant abdominal tenderness with rebound tenderness   7. Preterm uterine contractions in third trimester, antepartum     Plan: Discharge home in stable condition.  Preterm Labor precautions and fetal kick counts Abd pain precautions.  Miralax 1-2 x/day until soft, daily BM's, then PRN. Push fluids.   Follow-up Information     [facility] Follow up on 08/28/2016.   Specialty:  Obstetrics and Gynecology Why:  as scheduled or sooner as needed if symptoms worsen.  Contact information: [address] [place]  [address] [phone]       [facility] [place] [facility] Follow up.   Why:  As needed if symptoms worsen Contact information: [address] [id number] mc [place] [address] [phone]          Allergies as of 08/19/2016      Reactions   Peach Seed Hives   peaches      Medication List    TAKE these medications   calcium carbonate 500 MG chewable tablet Commonly known as:  TUMS - dosed in mg elemental calcium Chew 1-2 tablets by mouth at bedtime as needed for indigestion or heartburn.   polyethylene glycol powder powder Commonly known as:  GLYCOLAX/MIRALAX Take 17 g by mouth daily as needed for moderate constipation.   Prenatal Adult Gummy/DHA/FA 0.4-25 MG Chew Chew 1 Dose by mouth daily.       [place], [place] 08/19/2016 3:39 AM

## 2016-08-19 NOTE — MAU Note (Signed)
Pt. back from radiology for CT scan...results pending.

## 2016-08-19 NOTE — Discharge Instructions (Signed)
Braxton Hicks Contractions °Contractions of the uterus can occur throughout pregnancy, but they are not always a sign that you are in labor. You may have practice contractions called Braxton Hicks contractions. These false labor contractions are sometimes confused with true labor. °What are Braxton Hicks contractions? °Braxton Hicks contractions are tightening movements that occur in the muscles of the uterus before labor. Unlike true labor contractions, these contractions do not result in opening (dilation) and thinning of the cervix. Toward the end of pregnancy (32-34 weeks), Braxton Hicks contractions can happen more often and may become stronger. These contractions are sometimes difficult to tell apart from true labor because they can be very uncomfortable. You should not feel embarrassed if you go to the hospital with false labor. °Sometimes, the only way to tell if you are in true labor is for your health care provider to look for changes in the cervix. The health care provider will do a physical exam and may monitor your contractions. If you are not in true labor, the exam should show that your cervix is not dilating and your water has not broken. °If there are no prenatal problems or other health problems associated with your pregnancy, it is completely safe for you to be sent home with false labor. You may continue to have Braxton Hicks contractions until you go into true labor. °How can I tell the difference between true labor and false labor? °· Differences °¨ False labor °¨ Contractions last 30-70 seconds.: Contractions are usually shorter and not as strong as true labor contractions. °¨ Contractions become very regular.: Contractions are usually irregular. °¨ Discomfort is usually felt in the top of the uterus, and it spreads to the lower abdomen and low back.: Contractions are often felt in the front of the lower abdomen and in the groin. °¨ Contractions do not go away with walking.: Contractions may  go away when you walk around or change positions while lying down. °¨ Contractions usually become more intense and increase in frequency.: Contractions get weaker and are shorter-lasting as time goes on. °¨ The cervix dilates and gets thinner.: The cervix usually does not dilate or become thin. °Follow these instructions at home: °¨ Take over-the-counter and prescription medicines only as told by your health care provider. °¨ Keep up with your usual exercises and follow other instructions from your health care provider. °¨ Eat and drink lightly if you think you are going into labor. °¨ If Braxton Hicks contractions are making you uncomfortable: °¨ Change your position from lying down or resting to walking, or change from walking to resting. °¨ Sit and rest in a tub of warm water. °¨ Drink enough fluid to keep your urine clear or pale yellow. Dehydration may cause these contractions. °¨ Do slow and deep breathing several times an hour. °¨ Keep all follow-up prenatal visits as told by your health care provider. This is important. °Contact a health care provider if: °¨ You have a fever. °¨ You have continuous pain in your abdomen. °Get help right away if: °¨ Your contractions become stronger, more regular, and closer together. °¨ You have fluid leaking or gushing from your vagina. °¨ You pass blood-tinged mucus (bloody show). °¨ You have bleeding from your vagina. °¨ You have low back pain that you never had before. °¨ You feel your baby’s head pushing down and causing pelvic pressure. °¨ Your baby is not moving inside you as much as it used to. °Summary °¨ Contractions that occur before labor are   called Braxton Hicks contractions, false labor, or practice contractions. °· Braxton Hicks contractions are usually shorter, weaker, farther apart, and less regular than true labor contractions. True labor contractions usually become progressively stronger and regular and they become more frequent. °· Manage discomfort from  Braxton Hicks contractions by changing position, resting in a warm bath, drinking plenty of water, or practicing deep breathing. °This information is not intended to replace advice given to you by your health care provider. Make sure you discuss any questions you have with your health care provider. °Document Released: 04/17/2005 Document Revised: 03/06/2016 Document Reviewed: 03/06/2016 °Elsevier Interactive Patient Education © 2017 Elsevier Inc[employer] ° ° °Constipation, Adult °Constipation is when a person has fewer bowel movements in a week than normal, has difficulty having a bowel movement, or has stools that are dry, hard, or larger than normal. Constipation may be caused by an underlying condition. It may become worse with age if a person takes certain medicines and does not take in enough fluids. °Follow these instructions at home: °Eating and drinking ° °· Eat foods that have a lot of fiber, such as fresh fruits and vegetables, whole grains, and beans. °· Limit foods that are high in fat, low in fiber, or overly processed, such as french fries, hamburgers, cookies, candies, and soda. °· Drink enough fluid to keep your urine clear or pale yellow. °General instructions °· Exercise regularly or as told by your health care provider. °· Go to the restroom when you have the urge to go. Do not hold it in. °· Take over-the-counter and prescription medicines only as told by your health care provider. These include any fiber supplements. °· Practice pelvic floor retraining exercises, such as deep breathing while relaxing the lower abdomen and pelvic floor relaxation during bowel movements. °· Watch your condition for any changes. °· Keep all follow-up visits as told by your health care provider. This is important. °Contact a health care provider if: °· You have pain that gets worse. °· You have a fever. °· You do not have a bowel movement after 4 days. °· You vomit. °· You are not hungry. °· You lose weight. °· You are  bleeding from the anus. °· You have thin, pencil-like stools. °Get help right away if: °· You have a fever and your symptoms suddenly get worse. °· You leak stool or have blood in your stool. °· Your abdomen is bloated. °· You have severe pain in your abdomen. °· You feel dizzy or you faint. °This information is not intended to replace advice given to you by your health care provider. Make sure you discuss any questions you have with your health care provider. °Document Released: 01/14/2004 Document Revised: 11/05/2015 Document Reviewed: 10/06/2015 °Elsevier Interactive Patient Education © 2017 [employer] Inc. ° °

## 2016-08-20 LAB — CULTURE, OB URINE: Culture: NO GROWTH

## 2016-08-21 LAB — GC/CHLAMYDIA PROBE AMP (~~LOC~~) NOT AT ARMC
Chlamydia: NEGATIVE
Neisseria Gonorrhea: NEGATIVE

## 2016-08-28 ENCOUNTER — Ambulatory Visit (INDEPENDENT_AMBULATORY_CARE_PROVIDER_SITE_OTHER): Payer: Medicaid Other | Admitting: Family Medicine

## 2016-08-28 ENCOUNTER — Encounter: Payer: Self-pay | Admitting: Family Medicine

## 2016-08-28 VITALS — BP 115/67 | HR 79 | Wt 140.8 lb

## 2016-08-28 DIAGNOSIS — N390 Urinary tract infection, site not specified: Secondary | ICD-10-CM

## 2016-08-28 DIAGNOSIS — I341 Nonrheumatic mitral (valve) prolapse: Secondary | ICD-10-CM

## 2016-08-28 DIAGNOSIS — O0990 Supervision of high risk pregnancy, unspecified, unspecified trimester: Secondary | ICD-10-CM

## 2016-08-28 DIAGNOSIS — I471 Supraventricular tachycardia: Secondary | ICD-10-CM

## 2016-08-28 DIAGNOSIS — Z23 Encounter for immunization: Secondary | ICD-10-CM

## 2016-08-28 DIAGNOSIS — O99419 Diseases of the circulatory system complicating pregnancy, unspecified trimester: Secondary | ICD-10-CM

## 2016-08-28 LAB — POCT URINALYSIS DIP (DEVICE)
Bilirubin Urine: NEGATIVE
Glucose, UA: NEGATIVE mg/dL
HGB URINE DIPSTICK: NEGATIVE
Ketones, ur: NEGATIVE mg/dL
Nitrite: NEGATIVE
PROTEIN: NEGATIVE mg/dL
Specific Gravity, Urine: 1.02 (ref 1.005–1.030)
UROBILINOGEN UA: 1 mg/dL (ref 0.0–1.0)
pH: 6 (ref 5.0–8.0)

## 2016-08-28 MED ORDER — NITROFURANTOIN MONOHYD MACRO 100 MG PO CAPS: 100.0000 mg | ORAL_CAPSULE | Freq: Two times a day (BID) | ORAL | 0 refills | Status: DC

## 2016-08-28 NOTE — Progress Notes (Addendum)
      Subjective:  Katelyn Fuentes is a 26 y.o. G2P1001 at [redacted]w[redacted]d being seen today for ongoing prenatal care.  She is currently monitored for the following issues for this high-risk pregnancy and has History of postpartum hemorrhage; SVT (supraventricular tachycardia) (HCC); Mitral valve anterior leaflet prolapse; Supervision of high risk pregnancy, antepartum; Mitral valve prolapse of mother during pregnancy; Preterm labor in third trimester without delivery; and Preterm contractions on her problem list.  Patient reports menstrual like cramping, lasts for , but do not feel like contractions. Denies abnormal discharge, LOF, contractions that are regular. Drinking a lot of fluids, using maternity belt. Denies urinary symptoms. Works at home. Started 2 weeks, daily, lasts about 45 sec and irregular throughout day. Stooling has improved with Miralax, having BM q2days. Taking prenatal vitamins with iron in it now. Patient had questions regarding PPH, answered questions, may need to have 2 units on hold 2/2 anemic (hb 9 recently)..  Contractions: Irregular. Vag. Bleeding: None.  Movement: Present. Denies leaking of fluid.   The following portions of the patient's history were reviewed and updated as appropriate: allergies, current medications, past family history, past medical history, past social history, past surgical history and problem list. Problem list updated.  Objective:   Vitals:   08/28/16 0746  BP: 115/67  Pulse: 79  Weight: 140 lb 12.8 oz (63.9 kg)    Fetal Status: Fetal Heart Rate (bpm): 140 Fundal Height: 31 cm Movement: Present      General:  Alert, oriented and cooperative. Patient is in no acute distress.  Skin: Skin is warm and dry. No rash noted.   Cardiovascular: Normal heart rate noted  Respiratory: Normal respiratory effort, no problems with respiration noted  Abdomen: Soft, gravid, appropriate for gestational age. Pain/Pressure: Present     Pelvic:  Cervical  exam deferred        Extremities: Normal range of motion.  Edema: None  Mental Status: Normal mood and affect. Normal behavior. Normal judgment and thought content.   Urinalysis:      Assessment and Plan:  Pregnancy: G2P1001 at [redacted]w[redacted]d  1. Supervision of high risk pregnancy, antepartum - Glucose Tolerance, 2 Hours w/1 Hour - CBC - RPR - HIV antibody - Tdap vaccine greater than or equal to 7yo IM - POCT urinalysis dip (device)  2. Preterm labor in third trimester without delivery - Received BMZ 4/5-4/6 - No more strong regular contractions, only occasional cramping or infrequent contractions  3. SVT (supraventricular tachycardia) (HCC)  4. Mitral valve anterior leaflet prolapse  5. Mitral valve prolapse of mother during pregnancy  6. Urinary tract infection without hematuria, site unspecified - Suspicious for UTI, having cramping, symptoms, +pyuria, -nitrites, +bacteria, sending for culture and will treat. Patient would like to wait for culture to come back before she will fill the Rx. Discussed warning signs/symptoms of pyelonephritis.  - Culture, OB Urine - nitrofurantoin, macrocrystal-monohydrate, (MACROBID) 100 MG capsule; Take 1 capsule (100 mg total) by mouth 2 (two) times daily.  Dispense: 14 capsule; Refill: 0   MOC: OCPs or Nuvaring MOF: Bottle feeding Preterm labor symptoms and general obstetric precautions including but not limited to vaginal bleeding, contractions, leaking of fluid and fetal movement were reviewed in detail with the patient. Please refer to After Visit Summary for other counseling recommendations.  Return in about 2 weeks (around 09/11/2016) for Routine OB visit.   Jen Mow, DO [facility], [facility]

## 2016-08-28 NOTE — Patient Instructions (Signed)
Preventing Preterm Birth °Preterm birth is when your baby is delivered between 20 weeks and 37 weeks of pregnancy. A full-term pregnancy lasts for at least 37 weeks. Preterm birth can be dangerous for your baby because the last few weeks of pregnancy are an important time for your baby's brain and lungs to grow. Many things can cause a baby to be born early. Sometimes the cause is not known. There are certain factors that make you more likely to experience preterm birth, such as: °· Having a previous baby born preterm. °· Being pregnant with twins or other multiples. °· Having had fertility treatment. °· Being overweight or underweight at the start of your pregnancy. °· Having any of the following during pregnancy: °¨ An infection, including a urinary tract infection (UTI) or an STI (sexually transmitted infection). °¨ High blood pressure. °¨ Diabetes. °¨ Vaginal bleeding. °· Being age 35 or older. °· Being age 18 or younger. °· Getting pregnant within 6 months of a previous pregnancy. °· Suffering extreme stress or physical or emotional abuse during pregnancy. °· Standing for long periods of time during pregnancy, such as working at a job that requires standing. °What are the risks? °The most serious risk of preterm birth is that the baby may not survive. This is more likely to happen if a baby is born before 34 weeks. Other risks and complications of preterm birth may include your baby having: °· Breathing problems. °· Brain damage that affects movement and coordination (cerebral palsy). °· Feeding difficulties. °· Vision or hearing problems. °· Infections or inflammation of the digestive tract (colitis). °· Developmental delays. °· Learning disabilities. °· Higher risk for diabetes, heart disease, and high blood pressure later in life. °What can I do to lower my risk? °Medical care °The most important thing you can do to lower your risk for preterm birth is to get routine medical care during pregnancy (prenatal  care). If you have a high risk of preterm birth, you may be referred to a health care provider who specializes in managing high-risk pregnancies (perinatologist). You may be given medicine to help prevent preterm birth. °Lifestyle changes °Certain lifestyle changes can also lower your risk of preterm birth: °· Wait at least 6 months after a pregnancy to become pregnant again. °· Try to plan pregnancy for when you are between 19 and 35 years old. °· Get to a healthy weight before getting pregnant. If you are overweight, work with your health care provider to safely lose weight. °· Do not use any products that contain nicotine or tobacco, such as cigarettes and e-cigarettes. If you need help quitting, ask your health care provider. °· Do not drink alcohol. °· Do not use drugs. °Where to find support: °For more support, consider: °· Talking with your health care provider. °· Talking with a therapist or substance abuse counselor, if you need help quitting. °· Working with a diet and nutrition specialist (dietitian) or a personal trainer to maintain a healthy weight. °· Joining a support group. °Where to find more information: °Learn more about preventing preterm birth from: °· Centers for Disease Control and Prevention: cdc.gov/reproductivehealth/maternalinfanthealth/pretermbirth.htm °· March of Dimes: marchofdimes.org/complications/premature-babies.aspx °· American Pregnancy Association: americanpregnancy.org/labor-and-birth/premature-labor °Contact a health care provider if: °· You have any of the following signs of preterm labor before 37 weeks: °¨ A change or increase in vaginal discharge. °¨ Fluid leaking from your vagina. °¨ Pressure or cramps in your lower abdomen. °¨ A backache that does not go away or gets worse. °¨   Regular tightening (contractions) in your lower abdomen. °Summary °· Preterm birth means having your baby during weeks 20-37 of pregnancy. °· Preterm birth may put your baby at risk for physical and  mental problems. °· Getting good prenatal care can help prevent preterm birth. °· You can lower your risk of preterm birth by making certain lifestyle changes, such as not smoking and not using alcohol. °This information is not intended to replace advice given to you by your health care provider. Make sure you discuss any questions you have with your health care provider. °Document Released: 06/01/2015 Document Revised: 12/25/2015 Document Reviewed: 12/25/2015 °Elsevier Interactive Patient Education © 2017 Elsevier Inc. ° °

## 2016-08-29 ENCOUNTER — Encounter: Payer: Self-pay | Admitting: Family Medicine

## 2016-08-29 LAB — CBC
HEMATOCRIT: 27.7 % — AB (ref 34.0–46.6)
HEMOGLOBIN: 9.2 g/dL — AB (ref 11.1–15.9)
MCH: 26.9 pg (ref 26.6–33.0)
MCHC: 33.2 g/dL (ref 31.5–35.7)
MCV: 81 fL (ref 79–97)
Platelets: 232 10*3/uL (ref 150–379)
RBC: 3.42 x10E6/uL — ABNORMAL LOW (ref 3.77–5.28)
RDW: 13.9 % (ref 12.3–15.4)
WBC: 6.1 10*3/uL (ref 3.4–10.8)

## 2016-08-29 LAB — GLUCOSE TOLERANCE, 2 HOURS W/ 1HR
GLUCOSE, 1 HOUR: 141 mg/dL (ref 65–179)
GLUCOSE, 2 HOUR: 98 mg/dL (ref 65–152)
Glucose, Fasting: 76 mg/dL (ref 65–91)

## 2016-08-29 LAB — HIV ANTIBODY (ROUTINE TESTING W REFLEX): HIV Screen 4th Generation wRfx: NONREACTIVE

## 2016-08-29 LAB — RPR: RPR Ser Ql: NONREACTIVE

## 2016-08-30 LAB — URINE CULTURE, OB REFLEX

## 2016-08-30 LAB — CULTURE, OB URINE

## 2016-09-04 ENCOUNTER — Encounter: Payer: Self-pay | Admitting: Physician Assistant

## 2016-09-04 DIAGNOSIS — R001 Bradycardia, unspecified: Secondary | ICD-10-CM | POA: Insufficient documentation

## 2016-09-04 DIAGNOSIS — I498 Other specified cardiac arrhythmias: Secondary | ICD-10-CM | POA: Insufficient documentation

## 2016-09-04 DIAGNOSIS — I341 Nonrheumatic mitral (valve) prolapse: Secondary | ICD-10-CM | POA: Insufficient documentation

## 2016-09-04 DIAGNOSIS — D649 Anemia, unspecified: Secondary | ICD-10-CM | POA: Insufficient documentation

## 2016-09-04 NOTE — Progress Notes (Signed)
Cardiology Office Note    Date:  09/05/2016  ID:  JUNIOR KENEDY, DOB [birth year], MRN [id number] PCP:  Henreitta Leber, PA-C  Cardiologist:  Dr. Delton See   Chief Complaint: follow up SVT and MVP  History of Present Illness:  Katelyn Fuentes is a 26 y.o. female with history of palpitations, mitral valve prolpase, anemia who presents for follow-up. She was previously seen by Dr. Delton See in 11/2014 with complaints of palpitations, described as her heart rate alternating between fast and slow, with associated tunnel vision, cold sweats, chills, and weakness. She had reported after the episodes there would be echest tightness and headaches lasting for about an hour. She reported "fainting" with two of the episodes. She was seen in the ED with negative d-dimer and normal telemetry. Hospital admission recommended for observation but the patient wished to go home. 2D echo 12/09/14: EF 55-60%, no RWMA, mild mitral valve prolapse. Event monitor 11/2014 showed sinus bradycardia to sinus tach,  profound sinus arrhythmia and increased vagal tone with episodes of sinus bradycardia dwon to 38 BPM but no pause longer then 2 seconds. Dr. Delton See recommended to hydrate aggressively with salt and Gatorade and sit down if feeling dizzy. F/u recommended in 2 months but she has not had appointment since that time. She was evaluated by OB in 07/2016 for pre-term contractions (received betamethazone) and then another encounter for abdominal pain felt due to constipation as CT abdomen did not show any appendicitis. Prior pregnancy in 2012 was notable for post-partum hemorrhage and anemia. She is now pregnant once more and is 33 weeks. Most recent labs 08/28/16 showed Hgb 9.2.  She presents back to clinic for routine follow-up of MVP/SVT. She states aside from the ER visit for contractions, her pregnancy has been going excellent. She says she's felt healthier this pregnancy than her previous one. Able to walk easier.  No chest pain, SOB, palpitations, or syncope. Feeling great today.    Past Medical History:  Diagnosis Date  . Anemia   . Asthma    childhood  . Mitral valve prolapse   . Postpartum hemorrhage   . Sinus arrhythmia   . Sinus bradycardia   . SVT (supraventricular tachycardia) (HCC)     Past Surgical History:  Procedure Laterality Date  . NO PAST SURGERIES      Current Medications: Current Outpatient Prescriptions  Medication Sig Dispense Refill  . calcium carbonate (TUMS - DOSED IN MG ELEMENTAL CALCIUM) 500 MG chewable tablet Chew 1-2 tablets by mouth at bedtime as needed for indigestion or heartburn.    . polyethylene glycol powder (GLYCOLAX/MIRALAX) powder Take 17 g by mouth daily as needed for moderate constipation. 500 g 2  . Prenatal MV & Min w/FA-DHA (PRENATAL ADULT GUMMY/DHA/FA) 0.4-25 MG CHEW Chew 1 Dose by mouth daily. 90 tablet 1   No current facility-administered medications for this visit.      Allergies:   Peach seed   Social History   Social History  . Marital status: Married    Spouse name: N/A  . Number of children: N/A  . Years of education: N/A   Social History Main Topics  . Smoking status: Never Smoker  . Smokeless tobacco: Never Used  . Alcohol use Yes     Comment: occasional  . Drug use: No  . Sexual activity: Yes   Other Topics Concern  . Not on file   Social History Narrative  . No narrative on file     Family History:  Family History  Problem Relation Age of Onset  . Heart disease Mother   . Hypertension Mother   . Heart disease Maternal Grandmother   . Hypertension Maternal Grandmother   . Diabetes Neg Hx   . Heart failure Neg Hx   . Hyperlipidemia Neg Hx     ROS:   Please see the history of present illness. Patient reports OB thinks baby will come earlier since she is already dilated. All other systems are reviewed and otherwise negative.    PHYSICAL EXAM:   VS:  BP 112/66   Pulse 95   Ht 5\' 6"  (1.676 m)   Wt 138 lb  12 oz (62.9 kg)   SpO2 99%   BMI 22.39 kg/m   BMI: Body mass index is 22.39 kg/m. GEN: Well nourished, well developed F, in no acute distress  HEENT: normocephalic, atraumatic Neck: no JVD, carotid bruits, or masses Cardiac: RRR; no murmurs, rubs, or gallops, no edema  Respiratory:  clear to auscultation bilaterally, normal work of breathing GI: soft, +BS, gravid uterus MS: no deformity or atrophy  Skin: warm and dry, no rash Neuro:  Alert and Oriented x 3, Strength and sensation are intact, follows commands Psych: euthymic mood, full affect  Wt Readings from Last 3 Encounters:  09/05/16 138 lb 12 oz (62.9 kg)  08/28/16 140 lb 12.8 oz (63.9 kg)  08/18/16 135 lb (61.2 kg)      Studies/Labs Reviewed:   EKG:  EKG was ordered today and personally reviewed by me and demonstrates NSR 86bpm, nonspecific ST-T changes similar to prior, normal intervals.  Recent Labs: 11/12/2015: ALT 15; BUN 6; Creatinine, Ser 0.73; Potassium 3.6; Sodium 137 08/19/2016: Hemoglobin 9.1 08/28/2016: Platelets 232   Lipid Panel No results found for: CHOL, TRIG, HDL, CHOLHDL, VLDL, LDLCALC, LDLDIRECT  Additional studies/ records that were reviewed today include: Summarized above.    ASSESSMENT & PLAN:   1. History of sinus arrhythmia/sinus bradycardia with suspected high vagal tone - reviewed with Dr. Ladona Ridgelaylor in clinic today, appropriate for patient's age. No symptoms related to this. Continue surveillance for now.  2. Suspected prior PSVT - no interim symptoms. 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 Continue surveillance. 3. Mild mitral valve prolapse - reviewed echo results with Dr. Ladona Ridgelaylor. Not felt to be of any hemodynamic significance at present time and we do not feel this should affect pregnancy in any way. She's not had any interim development of cardiac murmur or signs of volume overload. Plan echo in 2021 as  previously recommended. 4. Anemia - this is being followed by OB closely with possible plan to keep PRBCs on hold at time of delivery given her history of post-partum hemorrhage. 5. Abnormal EKG - this is stable compared to prior. Normal LVEF on prior echo. No cardiac symptoms. Follow for now.  Disposition: F/u with Dr. Delton SeeNelson in 1 year.   Medication Adjustments/Labs and Tests Ordered: Current medicines are reviewed at length with the patient today.  Concerns regarding medicines are outlined above. Medication changes, Labs and Tests ordered today are summarized above and listed in the Patient Instructions accessible in Encounters.   Thomasene MohairSigned, Dayna Dunn PA-C  09/05/2016 8:29 AM    [facility] [address], [place], [place]  [address] Phone: [phone]; Fax: [phone]

## 2016-09-05 ENCOUNTER — Ambulatory Visit (INDEPENDENT_AMBULATORY_CARE_PROVIDER_SITE_OTHER): Payer: Medicaid Other | Admitting: Physician Assistant

## 2016-09-05 ENCOUNTER — Encounter: Payer: Self-pay | Admitting: Physician Assistant

## 2016-09-05 VITALS — BP 112/66 | HR 95 | Ht 66.0 in | Wt 138.8 lb

## 2016-09-05 DIAGNOSIS — I499 Cardiac arrhythmia, unspecified: Secondary | ICD-10-CM | POA: Diagnosis not present

## 2016-09-05 DIAGNOSIS — D649 Anemia, unspecified: Secondary | ICD-10-CM

## 2016-09-05 DIAGNOSIS — R001 Bradycardia, unspecified: Secondary | ICD-10-CM | POA: Diagnosis not present

## 2016-09-05 DIAGNOSIS — I498 Other specified cardiac arrhythmias: Secondary | ICD-10-CM

## 2016-09-05 DIAGNOSIS — I471 Supraventricular tachycardia: Secondary | ICD-10-CM | POA: Diagnosis not present

## 2016-09-05 DIAGNOSIS — R9431 Abnormal electrocardiogram [ECG] [EKG]: Secondary | ICD-10-CM

## 2016-09-05 DIAGNOSIS — I341 Nonrheumatic mitral (valve) prolapse: Secondary | ICD-10-CM

## 2016-09-05 NOTE — Patient Instructions (Addendum)
Medication Instructions:  Your physician recommends that you continue on your current medications as directed. Please refer to the Current Medication list given to you today.   Labwork: None ordered  Testing/Procedures: None ordered  Follow-Up: Your physician wants you to follow-up in: 1 YEAR WITH DR. NELSON.  You will receive a reminder letter in the mail two months in advance. If you don't receive a letter, please call our office to schedule the follow-up appointment.   Any Other Special Instructions Will Be Listed Below (If Applicable).     If you need a refill on your cardiac medications before your next appointment, please call your pharmacy.   

## 2016-09-13 ENCOUNTER — Encounter: Payer: Self-pay | Admitting: Family Medicine

## 2016-09-13 ENCOUNTER — Ambulatory Visit (INDEPENDENT_AMBULATORY_CARE_PROVIDER_SITE_OTHER): Payer: Medicaid Other | Admitting: Family Medicine

## 2016-09-13 VITALS — BP 111/63 | HR 80 | Wt 140.0 lb

## 2016-09-13 DIAGNOSIS — O99413 Diseases of the circulatory system complicating pregnancy, third trimester: Secondary | ICD-10-CM

## 2016-09-13 DIAGNOSIS — I341 Nonrheumatic mitral (valve) prolapse: Secondary | ICD-10-CM

## 2016-09-13 DIAGNOSIS — O0990 Supervision of high risk pregnancy, unspecified, unspecified trimester: Secondary | ICD-10-CM

## 2016-09-13 DIAGNOSIS — O99419 Diseases of the circulatory system complicating pregnancy, unspecified trimester: Secondary | ICD-10-CM

## 2016-09-13 DIAGNOSIS — O0993 Supervision of high risk pregnancy, unspecified, third trimester: Secondary | ICD-10-CM

## 2016-09-13 MED ORDER — NIFEDIPINE ER OSMOTIC RELEASE 30 MG PO TB24: 30.0000 mg | ORAL_TABLET | Freq: Every day | ORAL | 0 refills | Status: DC

## 2016-09-13 NOTE — Patient Instructions (Signed)
 Third Trimester of Pregnancy The third trimester is from week 28 through week 40 (months 7 through 9). The third trimester is a time when the unborn baby (fetus) is growing rapidly. At the end of the ninth month, the fetus is about 20 inches in length and weighs 6-10 pounds. Body changes during your third trimester Your body will continue to go through many changes during pregnancy. The changes vary from woman to woman. During the third trimester:  Your weight will continue to increase. You can expect to gain 25-35 pounds (11-16 kg) by the end of the pregnancy.  You may begin to get stretch marks on your hips, abdomen, and breasts.  You may urinate more often because the fetus is moving lower into your pelvis and pressing on your bladder.  You may develop or continue to have heartburn. This is caused by increased hormones that slow down muscles in the digestive tract.  You may develop or continue to have constipation because increased hormones slow digestion and cause the muscles that push waste through your intestines to relax.  You may develop hemorrhoids. These are swollen veins (varicose veins) in the rectum that can itch or be painful.  You may develop swollen, bulging veins (varicose veins) in your legs.  You may have increased body aches in the pelvis, back, or thighs. This is due to weight gain and increased hormones that are relaxing your joints.  You may have changes in your hair. These can include thickening of your hair, rapid growth, and changes in texture. Some women also have hair loss during or after pregnancy, or hair that feels dry or thin. Your hair will most likely return to normal after your baby is born.  Your breasts will continue to grow and they will continue to become tender. A yellow fluid (colostrum) may leak from your breasts. This is the first milk you are producing for your baby.  Your belly button may stick out.  You may notice more swelling in your  hands, face, or ankles.  You may have increased tingling or numbness in your hands, arms, and legs. The skin on your belly may also feel numb.  You may feel short of breath because of your expanding uterus.  You may have more problems sleeping. This can be caused by the size of your belly, increased need to urinate, and an increase in your body's metabolism.  You may notice the fetus "dropping," or moving lower in your abdomen (lightening).  You may have increased vaginal discharge.  You may notice your joints feel loose and you may have pain around your pelvic bone.  What to expect at prenatal visits You will have prenatal exams every 2 weeks until week 36. Then you will have weekly prenatal exams. During a routine prenatal visit:  You will be weighed to make sure you and the baby are growing normally.  Your blood pressure will be taken.  Your abdomen will be measured to track your baby's growth.  The fetal heartbeat will be listened to.  Any test results from the previous visit will be discussed.  You may have a cervical check near your due date to see if your cervix has softened or thinned (effaced).  You will be tested for Group B streptococcus. This happens between 35 and 37 weeks.  Your health care provider may ask you:  What your birth plan is.  How you are feeling.  If you are feeling the baby move.  If you have   had any abnormal symptoms, such as leaking fluid, bleeding, severe headaches, or abdominal cramping.  If you are using any tobacco products, including cigarettes, chewing tobacco, and electronic cigarettes.  If you have any questions.  Other tests or screenings that may be performed during your third trimester include:  Blood tests that check for low iron levels (anemia).  Fetal testing to check the health, activity level, and growth of the fetus. Testing is done if you have certain medical conditions or if there are problems during the  pregnancy.  Nonstress test (NST). This test checks the health of your baby to make sure there are no signs of problems, such as the baby not getting enough oxygen. During this test, a belt is placed around your belly. The baby is made to move, and its heart rate is monitored during movement.  What is false labor? False labor is a condition in which you feel small, irregular tightenings of the muscles in the womb (contractions) that usually go away with rest, changing position, or drinking water. These are called Braxton Hicks contractions. Contractions may last for hours, days, or even weeks before true labor sets in. If contractions come at regular intervals, become more frequent, increase in intensity, or become painful, you should see your health care provider. What are the signs of labor?  Abdominal cramps.  Regular contractions that start at 10 minutes apart and become stronger and more frequent with time.  Contractions that start on the top of the uterus and spread down to the lower abdomen and back.  Increased pelvic pressure and dull back pain.  A watery or bloody mucus discharge that comes from the vagina.  Leaking of amniotic fluid. This is also known as your "water breaking." It could be a slow trickle or a gush. Let your health care provider know if it has a color or strange odor. If you have any of these signs, call your health care provider right away, even if it is before your due date. Follow these instructions at home: Medicines  Follow your health care provider's instructions regarding medicine use. Specific medicines may be either safe or unsafe to take during pregnancy.  Take a prenatal vitamin that contains at least 600 micrograms (mcg) of folic acid.  If you develop constipation, try taking a stool softener if your health care provider approves. Eating and drinking  Eat a balanced diet that includes fresh fruits and vegetables, whole grains, good sources of protein  such as meat, eggs, or tofu, and low-fat dairy. Your health care provider will help you determine the amount of weight gain that is right for you.  Avoid raw meat and uncooked cheese. These carry germs that can cause birth defects in the baby.  If you have low calcium intake from food, talk to your health care provider about whether you should take a daily calcium supplement.  Eat four or five small meals rather than three large meals a day.  Limit foods that are high in fat and processed sugars, such as fried and sweet foods.  To prevent constipation: ? Drink enough fluid to keep your urine clear or pale yellow. ? Eat foods that are high in fiber, such as fresh fruits and vegetables, whole grains, and beans. Activity  Exercise only as directed by your health care provider. Most women can continue their usual exercise routine during pregnancy. Try to exercise for 30 minutes at least 5 days a week. Stop exercising if you experience uterine contractions.  Avoid   heavy lifting.  Do not exercise in extreme heat or humidity, or at high altitudes.  Wear low-heel, comfortable shoes.  Practice good posture.  You may continue to have sex unless your health care provider tells you otherwise. Relieving pain and discomfort  Take frequent breaks and rest with your legs elevated if you have leg cramps or low back pain.  Take warm sitz baths to soothe any pain or discomfort caused by hemorrhoids. Use hemorrhoid cream if your health care provider approves.  Wear a good support bra to prevent discomfort from breast tenderness.  If you develop varicose veins: ? Wear support pantyhose or compression stockings as told by your healthcare provider. ? Elevate your feet for 15 minutes, 3-4 times a day. Prenatal care  Write down your questions. Take them to your prenatal visits.  Keep all your prenatal visits as told by your health care provider. This is important. Safety  Wear your seat belt at  all times when driving.  Make a list of emergency phone numbers, including numbers for family, friends, the hospital, and police and fire departments. General instructions  Avoid cat litter boxes and soil used by cats. These carry germs that can cause birth defects in the baby. If you have a cat, ask someone to clean the litter box for you.  Do not travel far distances unless it is absolutely necessary and only with the approval of your health care provider.  Do not use hot tubs, steam rooms, or saunas.  Do not drink alcohol.  Do not use any products that contain nicotine or tobacco, such as cigarettes and e-cigarettes. If you need help quitting, ask your health care provider.  Do not use any medicinal herbs or unprescribed drugs. These chemicals affect the formation and growth of the baby.  Do not douche or use tampons or scented sanitary pads.  Do not cross your legs for long periods of time.  To prepare for the arrival of your baby: ? Take prenatal classes to understand, practice, and ask questions about labor and delivery. ? Make a trial run to the hospital. ? Visit the hospital and tour the maternity area. ? Arrange for maternity or paternity leave through employers. ? Arrange for family and friends to take care of pets while you are in the hospital. ? Purchase a rear-facing car seat and make sure you know how to install it in your car. ? Pack your hospital bag. ? Prepare the baby's nursery. Make sure to remove all pillows and stuffed animals from the baby's crib to prevent suffocation.  Visit your dentist if you have not gone during your pregnancy. Use a soft toothbrush to brush your teeth and be gentle when you floss. Contact a health care provider if:  You are unsure if you are in labor or if your water has broken.  You become dizzy.  You have mild pelvic cramps, pelvic pressure, or nagging pain in your abdominal area.  You have lower back pain.  You have persistent  nausea, vomiting, or diarrhea.  You have an unusual or bad smelling vaginal discharge.  You have pain when you urinate. Get help right away if:  Your water breaks before 37 weeks.  You have regular contractions less than 5 minutes apart before 37 weeks.  You have a fever.  You are leaking fluid from your vagina.  You have spotting or bleeding from your vagina.  You have severe abdominal pain or cramping.  You have rapid weight loss or weight   gain.  You have shortness of breath with chest pain.  You notice sudden or extreme swelling of your face, hands, ankles, feet, or legs.  Your baby makes fewer than 10 movements in 2 hours.  You have severe headaches that do not go away when you take medicine.  You have vision changes. Summary  The third trimester is from week 28 through week 40, months 7 through 9. The third trimester is a time when the unborn baby (fetus) is growing rapidly.  During the third trimester, your discomfort may increase as you and your baby continue to gain weight. You may have abdominal, leg, and back pain, sleeping problems, and an increased need to urinate.  During the third trimester your breasts will keep growing and they will continue to become tender. A yellow fluid (colostrum) may leak from your breasts. This is the first milk you are producing for your baby.  False labor is a condition in which you feel small, irregular tightenings of the muscles in the womb (contractions) that eventually go away. These are called Braxton Hicks contractions. Contractions may last for hours, days, or even weeks before true labor sets in.  Signs of labor can include: abdominal cramps; regular contractions that start at 10 minutes apart and become stronger and more frequent with time; watery or bloody mucus discharge that comes from the vagina; increased pelvic pressure and dull back pain; and leaking of amniotic fluid. This information is not intended to replace advice  given to you by your health care provider. Make sure you discuss any questions you have with your health care provider. Document Released: 04/11/2001 Document Revised: 09/23/2015 Document Reviewed: 06/18/2012 [employer] Interactive Patient Education  2017 [employer]   Breastfeeding Deciding to breastfeed is one of the best choices you can make for you and your baby. A change in hormones during pregnancy causes your breast tissue to grow and increases the number and size of your milk ducts. These hormones also allow proteins, sugars, and fats from your blood supply to make breast milk in your milk-producing glands. Hormones prevent breast milk from being released before your baby is born as well as prompt milk flow after birth. Once breastfeeding has begun, thoughts of your baby, as well as his or her sucking or crying, can stimulate the release of milk from your milk-producing glands. Benefits of breastfeeding For Your Baby  Your first milk (colostrum) helps your baby's digestive system function better.  There are antibodies in your milk that help your baby fight off infections.  Your baby has a lower incidence of asthma, allergies, and sudden infant death syndrome.  The nutrients in breast milk are better for your baby than infant formulas and are designed uniquely for your baby's needs.  Breast milk improves your baby's brain development.  Your baby is less likely to develop other conditions, such as childhood obesity, asthma, or type 2 diabetes mellitus.  For You  Breastfeeding helps to create a very special bond between you and your baby.  Breastfeeding is convenient. Breast milk is always available at the correct temperature and costs nothing.  Breastfeeding helps to burn calories and helps you lose the weight gained during pregnancy.  Breastfeeding makes your uterus contract to its prepregnancy size faster and slows bleeding (lochia) after you give birth.  Breastfeeding helps  to lower your risk of developing type 2 diabetes mellitus, osteoporosis, and breast or ovarian cancer later in life.  Signs that your baby is hungry Early Signs of Hunger    Increased alertness or activity.  Stretching.  Movement of the head from side to side.  Movement of the head and opening of the mouth when the corner of the mouth or cheek is stroked (rooting).  Increased sucking sounds, smacking lips, cooing, sighing, or squeaking.  Hand-to-mouth movements.  Increased sucking of fingers or hands.  Late Signs of Hunger  Fussing.  Intermittent crying.  Extreme Signs of Hunger Signs of extreme hunger will require calming and consoling before your baby will be able to breastfeed successfully. Do not wait for the following signs of extreme hunger to occur before you initiate breastfeeding:  Restlessness.  A loud, strong cry.  Screaming.  Breastfeeding basics Breastfeeding Initiation  Find a comfortable place to sit or lie down, with your neck and back well supported.  Place a pillow or rolled up blanket under your baby to bring him or her to the level of your breast (if you are seated). Nursing pillows are specially designed to help support your arms and your baby while you breastfeed.  Make sure that your baby's abdomen is facing your abdomen.  Gently massage your breast. With your fingertips, massage from your chest wall toward your nipple in a circular motion. This encourages milk flow. You may need to continue this action during the feeding if your milk flows slowly.  Support your breast with 4 fingers underneath and your thumb above your nipple. Make sure your fingers are well away from your nipple and your baby's mouth.  Stroke your baby's lips gently with your finger or nipple.  When your baby's mouth is open wide enough, quickly bring your baby to your breast, placing your entire nipple and as much of the colored area around your nipple (areola) as possible into  your baby's mouth. ? More areola should be visible above your baby's upper lip than below the lower lip. ? Your baby's tongue should be between his or her lower gum and your breast.  Ensure that your baby's mouth is correctly positioned around your nipple (latched). Your baby's lips should create a seal on your breast and be turned out (everted).  It is common for your baby to suck about 2-3 minutes in order to start the flow of breast milk.  Latching Teaching your baby how to latch on to your breast properly is very important. An improper latch can cause nipple pain and decreased milk supply for you and poor weight gain in your baby. Also, if your baby is not latched onto your nipple properly, he or she may swallow some air during feeding. This can make your baby fussy. Burping your baby when you switch breasts during the feeding can help to get rid of the air. However, teaching your baby to latch on properly is still the best way to prevent fussiness from swallowing air while breastfeeding. Signs that your baby has successfully latched on to your nipple:  Silent tugging or silent sucking, without causing you pain.  Swallowing heard between every 3-4 sucks.  Muscle movement above and in front of his or her ears while sucking.  Signs that your baby has not successfully latched on to nipple:  Sucking sounds or smacking sounds from your baby while breastfeeding.  Nipple pain.  If you think your baby has not latched on correctly, slip your finger into the corner of your baby's mouth to break the suction and place it between your baby's gums. Attempt breastfeeding initiation again. Signs of Successful Breastfeeding Signs from your baby:  A   gradual decrease in the number of sucks or complete cessation of sucking.  Falling asleep.  Relaxation of his or her body.  Retention of a small amount of milk in his or her mouth.  Letting go of your breast by himself or herself.  Signs from  you:  Breasts that have increased in firmness, weight, and size 1-3 hours after feeding.  Breasts that are softer immediately after breastfeeding.  Increased milk volume, as well as a change in milk consistency and color by the fifth day of breastfeeding.  Nipples that are not sore, cracked, or bleeding.  Signs That Your Baby is Getting Enough Milk  Wetting at least 1-2 diapers during the first 24 hours after birth.  Wetting at least 5-6 diapers every 24 hours for the first week after birth. The urine should be clear or pale yellow by 5 days after birth.  Wetting 6-8 diapers every 24 hours as your baby continues to grow and develop.  At least 3 stools in a 24-hour period by age 5 days. The stool should be soft and yellow.  At least 3 stools in a 24-hour period by age 7 days. The stool should be seedy and yellow.  No loss of weight greater than 10% of birth weight during the first 3 days of age.  Average weight gain of 4-7 ounces (113-198 g) per week after age 4 days.  Consistent daily weight gain by age 5 days, without weight loss after the age of 2 weeks.  After a feeding, your baby may spit up a small amount. This is common. Breastfeeding frequency and duration Frequent feeding will help you make more milk and can prevent sore nipples and breast engorgement. Breastfeed when you feel the need to reduce the fullness of your breasts or when your baby shows signs of hunger. This is called "breastfeeding on demand." Avoid introducing a pacifier to your baby while you are working to establish breastfeeding (the first 4-6 weeks after your baby is born). After this time you may choose to use a pacifier. Research has shown that pacifier use during the first year of a baby's life decreases the risk of sudden infant death syndrome (SIDS). Allow your baby to feed on each breast as long as he or she wants. Breastfeed until your baby is finished feeding. When your baby unlatches or falls asleep  while feeding from the first breast, offer the second breast. Because newborns are often sleepy in the first few weeks of life, you may need to awaken your baby to get him or her to feed. Breastfeeding times will vary from baby to baby. However, the following rules can serve as a guide to help you ensure that your baby is properly fed:  Newborns (babies 4 weeks of age or younger) may breastfeed every 1-3 hours.  Newborns should not go longer than 3 hours during the day or 5 hours during the night without breastfeeding.  You should breastfeed your baby a minimum of 8 times in a 24-hour period until you begin to introduce solid foods to your baby at around 6 months of age.  Breast milk pumping Pumping and storing breast milk allows you to ensure that your baby is exclusively fed your breast milk, even at times when you are unable to breastfeed. This is especially important if you are going back to work while you are still breastfeeding or when you are not able to be present during feedings. Your lactation consultant can give you guidelines on how   long it is safe to store breast milk. A breast pump is a machine that allows you to pump milk from your breast into a sterile bottle. The pumped breast milk can then be stored in a refrigerator or freezer. Some breast pumps are operated by hand, while others use electricity. Ask your lactation consultant which type will work best for you. Breast pumps can be purchased, but some hospitals and breastfeeding support groups lease breast pumps on a monthly basis. A lactation consultant can teach you how to hand express breast milk, if you prefer not to use a pump. Caring for your breasts while you breastfeed Nipples can become dry, cracked, and sore while breastfeeding. The following recommendations can help keep your breasts moisturized and healthy:  Avoid using soap on your nipples.  Wear a supportive bra. Although not required, special nursing bras and tank  tops are designed to allow access to your breasts for breastfeeding without taking off your entire bra or top. Avoid wearing underwire-style bras or extremely tight bras.  Air dry your nipples for 3-4minutes after each feeding.  Use only cotton bra pads to absorb leaked breast milk. Leaking of breast milk between feedings is normal.  Use lanolin on your nipples after breastfeeding. Lanolin helps to maintain your skin's normal moisture barrier. If you use pure lanolin, you do not need to wash it off before feeding your baby again. Pure lanolin is not toxic to your baby. You may also hand express a few drops of breast milk and gently massage that milk into your nipples and allow the milk to air dry.  In the first few weeks after giving birth, some women experience extremely full breasts (engorgement). Engorgement can make your breasts feel heavy, warm, and tender to the touch. Engorgement peaks within 3-5 days after you give birth. The following recommendations can help ease engorgement:  Completely empty your breasts while breastfeeding or pumping. You may want to start by applying warm, moist heat (in the shower or with warm water-soaked hand towels) just before feeding or pumping. This increases circulation and helps the milk flow. If your baby does not completely empty your breasts while breastfeeding, pump any extra milk after he or she is finished.  Wear a snug bra (nursing or regular) or tank top for 1-2 days to signal your body to slightly decrease milk production.  Apply ice packs to your breasts, unless this is too uncomfortable for you.  Make sure that your baby is latched on and positioned properly while breastfeeding.  If engorgement persists after 48 hours of following these recommendations, contact your health care provider or a lactation consultant. Overall health care recommendations while breastfeeding  Eat healthy foods. Alternate between meals and snacks, eating 3 of each per  day. Because what you eat affects your breast milk, some of the foods may make your baby more irritable than usual. Avoid eating these foods if you are sure that they are negatively affecting your baby.  Drink milk, fruit juice, and water to satisfy your thirst (about 10 glasses a day).  Rest often, relax, and continue to take your prenatal vitamins to prevent fatigue, stress, and anemia.  Continue breast self-awareness checks.  Avoid chewing and smoking tobacco. Chemicals from cigarettes that pass into breast milk and exposure to secondhand smoke may harm your baby.  Avoid alcohol and drug use, including marijuana. Some medicines that may be harmful to your baby can pass through breast milk. It is important to ask your health care   provider before taking any medicine, including all over-the-counter and prescription medicine as well as vitamin and herbal supplements. It is possible to become pregnant while breastfeeding. If birth control is desired, ask your health care provider about options that will be safe for your baby. Contact a health care provider if:  You feel like you want to stop breastfeeding or have become frustrated with breastfeeding.  You have painful breasts or nipples.  Your nipples are cracked or bleeding.  Your breasts are red, tender, or warm.  You have a swollen area on either breast.  You have a fever or chills.  You have nausea or vomiting.  You have drainage other than breast milk from your nipples.  Your breasts do not become full before feedings by the fifth day after you give birth.  You feel sad and depressed.  Your baby is too sleepy to eat well.  Your baby is having trouble sleeping.  Your baby is wetting less than 3 diapers in a 24-hour period.  Your baby has less than 3 stools in a 24-hour period.  Your baby's skin or the white part of his or her eyes becomes yellow.  Your baby is not gaining weight by 5 days of age. Get help right away  if:  Your baby is overly tired (lethargic) and does not want to wake up and feed.  Your baby develops an unexplained fever. This information is not intended to replace advice given to you by your health care provider. Make sure you discuss any questions you have with your health care provider. Document Released: 04/17/2005 Document Revised: 09/29/2015 Document Reviewed: 10/09/2012 Elsevier Interactive Patient Education  2017 [employer]  

## 2016-09-13 NOTE — Progress Notes (Signed)
Patient reports contractions several times a day lasting an hour. Also reports "losing mucous plug". Patient reports worsening heartburn

## 2016-09-13 NOTE — Progress Notes (Signed)
   PRENATAL VISIT NOTE  Subjective:  Katelyn Fuentes is a 26 y.o. G2P1001 at 1891w1d being seen today for ongoing prenatal care.  She is currently monitored for the following issues for this high-risk pregnancy and has History of postpartum hemorrhage; Anemia affecting pregnancy; SVT (supraventricular tachycardia) (HCC); Mitral valve anterior leaflet prolapse; Supervision of high risk pregnancy, antepartum; Mitral valve prolapse of mother during pregnancy; Preterm labor in third trimester without delivery; Preterm contractions; Sinus arrhythmia; Sinus bradycardia; Anemia; Anxiety; and Depression on her problem list.  Patient reports contractions since 4 wks ago, s/p BMZ--still wtih multiple contractions daily.  Contractions: Irregular. Vag. Bleeding: None.  Movement: Present. Denies leaking of fluid.   The following portions of the patient's history were reviewed and updated as appropriate: allergies, current medications, past family history, past medical history, past social history, past surgical history and problem list. Problem list updated.  Objective:   Vitals:   09/13/16 0853  BP: 111/63  Pulse: 80  Weight: 140 lb (63.5 kg)    Fetal Status: Fetal Heart Rate (bpm): 130 Fundal Height: 34 cm Movement: Present  Presentation: Vertex  General:  Alert, oriented and cooperative. Patient is in no acute distress.  Skin: Skin is warm and dry. No rash noted.   Cardiovascular: Normal heart rate noted  Respiratory: Normal respiratory effort, no problems with respiration noted  Abdomen: Soft, gravid, appropriate for gestational age. Pain/Pressure: Present     Pelvic:  Cervical exam performed Dilation: 2 Effacement (%): 30 Station: -2  Extremities: Normal range of motion.  Edema: Trace  Mental Status: Normal mood and affect. Normal behavior. Normal judgment and thought content.   Assessment and Plan:  Pregnancy: G2P1001 at 2591w1d  1. Supervision of high risk pregnancy,  antepartum Continue routine prenatal care. Cultures next visit  2. Preterm labor in third trimester without delivery Trial of procardia for 2 wks--discussed with pharmacy--no real contraindication, may lead to increased HR--she will let us know how she tolerates this. - NIFEdipine (PROCARDIA XL) 30 MG 24 hr tablet; Take 1 tablet (30 mg total) by mouth daily.  Dispense: 60 tablet; Refill: 0  3. Mitral valve prolapse of mother during pregnancy Normal ECHO--for F/u in 2021  Preterm labor symptoms and general obstetric precautions including but not limited to vaginal bleeding, contractions, leaking of fluid and fetal movement were reviewed in detail with the patient. Please refer to After Visit Summary for other counseling recommendations.  Return in 2 weeks (on 09/27/2016).   Reva BoresPratt, Kaylyne Axton S, MD

## 2016-09-13 NOTE — Progress Notes (Signed)
Breastfeeding discussed with patient  

## 2016-09-14 ENCOUNTER — Encounter: Payer: Medicaid Other | Admitting: Obstetrics and Gynecology

## 2016-09-15 ENCOUNTER — Ambulatory Visit: Payer: Medicaid Other | Admitting: Cardiology

## 2016-09-18 ENCOUNTER — Encounter (HOSPITAL_COMMUNITY): Payer: Self-pay | Admitting: *Deleted

## 2016-09-18 ENCOUNTER — Inpatient Hospital Stay (HOSPITAL_COMMUNITY)
Admission: AD | Admit: 2016-09-18 | Discharge: 2016-09-18 | Disposition: A | Discharge status: Home / Self Care | Payer: Medicaid Other | Source: Ambulatory Visit | Attending: Obstetrics & Gynecology | Admitting: Obstetrics & Gynecology

## 2016-09-18 DIAGNOSIS — O99413 Diseases of the circulatory system complicating pregnancy, third trimester: Secondary | ICD-10-CM | POA: Insufficient documentation

## 2016-09-18 DIAGNOSIS — N898 Other specified noninflammatory disorders of vagina: Secondary | ICD-10-CM | POA: Diagnosis not present

## 2016-09-18 DIAGNOSIS — I471 Supraventricular tachycardia: Secondary | ICD-10-CM | POA: Diagnosis not present

## 2016-09-18 DIAGNOSIS — Z79899 Other long term (current) drug therapy: Secondary | ICD-10-CM | POA: Insufficient documentation

## 2016-09-18 DIAGNOSIS — Z3A35 35 weeks gestation of pregnancy: Secondary | ICD-10-CM | POA: Diagnosis not present

## 2016-09-18 DIAGNOSIS — O9989 Other specified diseases and conditions complicating pregnancy, childbirth and the puerperium: Secondary | ICD-10-CM | POA: Diagnosis not present

## 2016-09-18 DIAGNOSIS — O26893 Other specified pregnancy related conditions, third trimester: Secondary | ICD-10-CM | POA: Insufficient documentation

## 2016-09-18 DIAGNOSIS — O0993 Supervision of high risk pregnancy, unspecified, third trimester: Secondary | ICD-10-CM | POA: Diagnosis not present

## 2016-09-18 DIAGNOSIS — Z0371 Encounter for suspected problem with amniotic cavity and membrane ruled out: Secondary | ICD-10-CM

## 2016-09-18 DIAGNOSIS — I341 Nonrheumatic mitral (valve) prolapse: Secondary | ICD-10-CM | POA: Diagnosis not present

## 2016-09-18 DIAGNOSIS — O0990 Supervision of high risk pregnancy, unspecified, unspecified trimester: Secondary | ICD-10-CM

## 2016-09-18 DIAGNOSIS — O99419 Diseases of the circulatory system complicating pregnancy, unspecified trimester: Secondary | ICD-10-CM

## 2016-09-18 LAB — URINALYSIS, ROUTINE W REFLEX MICROSCOPIC
Bilirubin Urine: NEGATIVE
Glucose, UA: NEGATIVE mg/dL
HGB URINE DIPSTICK: NEGATIVE
KETONES UR: NEGATIVE mg/dL
LEUKOCYTES UA: NEGATIVE
Nitrite: NEGATIVE
PROTEIN: NEGATIVE mg/dL
Specific Gravity, Urine: 1.005 (ref 1.005–1.030)
pH: 6 (ref 5.0–8.0)

## 2016-09-18 LAB — AMNISURE RUPTURE OF MEMBRANE (ROM) NOT AT ARMC: AMNISURE: NEGATIVE

## 2016-09-18 NOTE — Discharge Instructions (Signed)

## 2016-09-18 NOTE — MAU Note (Signed)
C/o ? SROM this AM @ 1115; c/o ucs since  1145;

## 2016-09-18 NOTE — MAU Provider Note (Signed)
Chief Complaint:  Rupture of Membranes   None     HPI: Katelyn Fuentes is a 26 y.o. G2P1001 at 5235w3dwho presents to maternity admissions reporting menstrual-like cramping x 2 days and leakage of clear fluid mixed with mucous x2-3 today.  She reports the leakage was enough to soak a napkin placed in her underwear and soak through to the underwear each time.  She reports she did not have any pads so is not sure if the fluid was enough to soak a pad.  Her cramping is intermittent, irregular, and mild like menstrual cramps but it is new onset and low in her abdomen and radiates to her low back.  She has tried drinking more water and changing positions/rest, but nothing makes it better or worse.  She reports some brown blood mixed with mucus yesterday but none today. Her cramping is associated with 1 episode of n/v/d that resolved after emesis and diarrhea x 1 this morning.  There are no other associated symptoms. She was dilated to 2 cm 2 weeks ago. She reports good fetal movement, vaginal bleeding, vaginal itching/burning, urinary symptoms, h/a, dizziness, n/v, or fever/chills.    HPI  Past Medical History: Past Medical History:  Diagnosis Date  . Anemia   . Asthma    childhood  . Mitral valve prolapse   . Postpartum hemorrhage   . Preterm labor   . Sinus arrhythmia   . Sinus bradycardia   . SVT (supraventricular tachycardia) (HCC)     Past obstetric history: OB History  Gravida Para Term Preterm AB Living  2 1 1  0 0 1  SAB TAB Ectopic Multiple Live Births  0 0 0 0 1    # Outcome Date GA Lbr Len/2nd Weight Sex Delivery Anes PTL Lv  2 Current           1 Term 04/12/11 5336w6d 08:08 / 00:57 6 lb 15.6 oz (3.165 kg) M Vag-Spont EPI  LIV      Past Surgical History: Past Surgical History:  Procedure Laterality Date  . NO PAST SURGERIES      Family History: Family History  Problem Relation Age of Onset  . Heart disease Mother   . Hypertension Mother   . Heart disease  Maternal Grandmother   . Hypertension Maternal Grandmother   . Diabetes Neg Hx   . Heart failure Neg Hx   . Hyperlipidemia Neg Hx     Social History: Social History  Substance Use Topics  . Smoking status: Never Smoker  . Smokeless tobacco: Never Used  . Alcohol use Yes     Comment: occasional    Allergies:  Allergies  Allergen Reactions  . Peach Seed Hives    peaches    Meds:  Prescriptions Prior to Admission  Medication Sig Dispense Refill Last Dose  . calcium carbonate (TUMS - DOSED IN MG ELEMENTAL CALCIUM) 500 MG chewable tablet Chew 1-2 tablets by mouth at bedtime as needed for indigestion or heartburn.   09/17/2016 at Unknown time  . Prenatal MV & Min w/FA-DHA (PRENATAL ADULT GUMMY/DHA/FA) 0.4-25 MG CHEW Chew 1 Dose by mouth daily. 90 tablet 1 09/17/2016 at Unknown time  . NIFEdipine (PROCARDIA XL) 30 MG 24 hr tablet Take 1 tablet (30 mg total) by mouth daily. (Patient not taking: Reported on 09/18/2016) 60 tablet 0 Not Taking at Unknown time  . polyethylene glycol powder (GLYCOLAX/MIRALAX) powder Take 17 g by mouth daily as needed for moderate constipation. (Patient not taking: Reported on 09/18/2016)  500 g 2 Not Taking at Unknown time    ROS:  Review of Systems  Constitutional: Negative for chills, fatigue and fever.  Eyes: Negative for visual disturbance.  Respiratory: Negative for shortness of breath.   Cardiovascular: Negative for chest pain.  Gastrointestinal: Negative for abdominal pain, nausea and vomiting.  Genitourinary: Positive for pelvic pain and vaginal discharge. Negative for difficulty urinating, dysuria, flank pain, vaginal bleeding and vaginal pain.  Neurological: Negative for dizziness and headaches.  Psychiatric/Behavioral: Negative.      I have reviewed patient's Past Medical Hx, Surgical Hx, Family Hx, Social Hx, medications and allergies.   Physical Exam   Patient Vitals for the past 24 hrs:  BP Temp Temp src Pulse Resp  09/18/16 1438 (!)  102/59 98.7 F (37.1 C) Oral 86 16   Constitutional: Well-developed, well-nourished female in no acute distress.  Cardiovascular: normal rate Respiratory: normal effort GI: Abd soft, non-tender, gravid appropriate for gestational age.  MS: Extremities nontender, no edema, normal ROM Neurologic: Alert and oriented x 4.  GU: Neg CVAT.  PELVIC EXAM: Cervix pink, visually open, without lesion, moderate white/yellow mucous, vaginal walls and external genitalia normal  Dilation: (P) 2 Effacement (%): (P) 50 Cervical Position: (P) Anterior Presentation: (P) Vertex Exam by:: (P) L Leftwich-Kirby CNM  FHT:  Baseline 135 , moderate variability, accelerations present, no decelerations Contractions: irregular, mild to palpation   Labs: Results for orders placed or performed during the hospital encounter of 09/18/16 (from the past 24 hour(s))  Urinalysis, Routine w reflex microscopic     Status: None   Collection Time: 09/18/16  2:29 PM  Result Value Ref Range   Color, Urine YELLOW YELLOW   APPearance CLEAR CLEAR   Specific Gravity, Urine 1.005 1.005 - 1.030   pH 6.0 5.0 - 8.0   Glucose, UA NEGATIVE NEGATIVE mg/dL   Hgb urine dipstick NEGATIVE NEGATIVE   Bilirubin Urine NEGATIVE NEGATIVE   Ketones, ur NEGATIVE NEGATIVE mg/dL   Protein, ur NEGATIVE NEGATIVE mg/dL   Nitrite NEGATIVE NEGATIVE   Leukocytes, UA NEGATIVE NEGATIVE  Amnisure rupture of membrane (rom)not at [facility]     Status: None   Collection Time: 09/18/16  3:40 PM  Result Value Ref Range   Amnisure ROM NEGATIVE    --/--/B POS (04/05 1500)  Imaging:  No results found.  MAU Course/MDM: I have ordered labs and reviewed results.  NST reviewed No evidence of rupture of membranes or preterm labor today Reviewed preterm labor signs Pt to f/u in office as scheduled, return to MAU for s/sx of labor or emergencies Pt stable at time of discharge.   Assessment: 1. Vaginal discharge during pregnancy in third trimester   2.  Supervision of high risk pregnancy, antepartum   3. Mitral valve prolapse of mother during pregnancy   4. Encounter for suspected premature rupture of amniotic membranes, with rupture of membranes not found     Plan: Discharge home Labor precautions and fetal kick counts  Follow-up Information    [facility] Follow up.   Specialty:  Obstetrics and Gynecology Why:  As scheduled, return to MAU as needed for signs of labor or emergencies Contact information: [address] [place] [address] [phone]         Allergies as of 09/18/2016      Reactions   Peach Seed Hives   peaches      Medication List    STOP taking these medications   NIFEdipine 30 MG  24 hr tablet Commonly known as:  PROCARDIA XL   polyethylene glycol powder powder Commonly known as:  GLYCOLAX/MIRALAX     TAKE these medications   calcium carbonate 500 MG chewable tablet Commonly known as:  TUMS - dosed in mg elemental calcium Chew 1-2 tablets by mouth at bedtime as needed for indigestion or heartburn.   Prenatal Adult Gummy/DHA/FA 0.4-25 MG Chew Chew 1 Dose by mouth daily.       Sharen Counter Certified Nurse-Midwife 09/18/2016 4:27 PM

## 2016-09-21 ENCOUNTER — Encounter (HOSPITAL_COMMUNITY): Payer: Self-pay

## 2016-09-21 ENCOUNTER — Inpatient Hospital Stay (HOSPITAL_COMMUNITY)
Admission: AD | Admit: 2016-09-21 | Discharge: 2016-09-21 | Disposition: A | Discharge status: Home / Self Care | Payer: Medicaid Other | Source: Ambulatory Visit | Attending: Obstetrics & Gynecology | Admitting: Obstetrics & Gynecology

## 2016-09-21 DIAGNOSIS — O26893 Other specified pregnancy related conditions, third trimester: Secondary | ICD-10-CM | POA: Diagnosis not present

## 2016-09-21 DIAGNOSIS — Z3A35 35 weeks gestation of pregnancy: Secondary | ICD-10-CM | POA: Diagnosis not present

## 2016-09-21 DIAGNOSIS — N898 Other specified noninflammatory disorders of vagina: Secondary | ICD-10-CM

## 2016-09-21 DIAGNOSIS — Z79899 Other long term (current) drug therapy: Secondary | ICD-10-CM | POA: Insufficient documentation

## 2016-09-21 DIAGNOSIS — J45909 Unspecified asthma, uncomplicated: Secondary | ICD-10-CM | POA: Diagnosis not present

## 2016-09-21 DIAGNOSIS — I471 Supraventricular tachycardia: Secondary | ICD-10-CM | POA: Insufficient documentation

## 2016-09-21 DIAGNOSIS — O99513 Diseases of the respiratory system complicating pregnancy, third trimester: Secondary | ICD-10-CM | POA: Diagnosis not present

## 2016-09-21 DIAGNOSIS — R109 Unspecified abdominal pain: Secondary | ICD-10-CM | POA: Diagnosis not present

## 2016-09-21 DIAGNOSIS — O4703 False labor before 37 completed weeks of gestation, third trimester: Secondary | ICD-10-CM

## 2016-09-21 DIAGNOSIS — O99413 Diseases of the circulatory system complicating pregnancy, third trimester: Secondary | ICD-10-CM | POA: Diagnosis not present

## 2016-09-21 DIAGNOSIS — O42913 Preterm premature rupture of membranes, unspecified as to length of time between rupture and onset of labor, third trimester: Secondary | ICD-10-CM | POA: Diagnosis present

## 2016-09-21 LAB — WET PREP, GENITAL
CLUE CELLS WET PREP: NONE SEEN
Sperm: NONE SEEN
TRICH WET PREP: NONE SEEN
WBC WET PREP: NONE SEEN
Yeast Wet Prep HPF POC: NONE SEEN

## 2016-09-21 LAB — AMNISURE RUPTURE OF MEMBRANE (ROM) NOT AT ARMC: AMNISURE: NEGATIVE

## 2016-09-21 MED ORDER — BETAMETHASONE SOD PHOS & ACET 6 (3-3) MG/ML IJ SUSP
12.0000 mg | Freq: Once | INTRAMUSCULAR | Status: AC
  Administered 2016-09-21: 12 mg via INTRAMUSCULAR
  Filled 2016-09-21: qty 2

## 2016-09-21 NOTE — MAU Provider Note (Signed)
History     CSN: [id number]  Arrival date and time: 09/21/16 1426  None     Chief Complaint  Patient presents with  . Rupture of Membranes   HPI Katelyn Fuentes is a 26 y.o. G2P1001 at 4294w6d who presents with LOF & abdominal cramping. Was seen in MAU earlier this week for r/o SROM & had negative amnisure. States intermittent thin discharge has continued. Also complains of worsening contractions throughout the night. Denies painful contractions at this time. No vaginal bleeding. Positive fetal movement.   OB History    Gravida Para Term Preterm AB Living   2 1 1  0 0 1   SAB TAB Ectopic Multiple Live Births   0 0 0 0 1      Past Medical History:  Diagnosis Date  . Anemia   . Asthma    childhood  . Mitral valve prolapse   . Postpartum hemorrhage   . Preterm labor   . Sinus arrhythmia   . Sinus bradycardia   . SVT (supraventricular tachycardia) (HCC)     Past Surgical History:  Procedure Laterality Date  . NO PAST SURGERIES      Family History  Problem Relation Age of Onset  . Heart disease Mother   . Hypertension Mother   . Heart disease Maternal Grandmother   . Hypertension Maternal Grandmother   . Diabetes Neg Hx   . Heart failure Neg Hx   . Hyperlipidemia Neg Hx     Social History  Substance Use Topics  . Smoking status: Never Smoker  . Smokeless tobacco: Never Used  . Alcohol use Yes     Comment: occasional    Allergies:  Allergies  Allergen Reactions  . Peach Seed Hives    peaches    Prescriptions Prior to Admission  Medication Sig Dispense Refill Last Dose  . calcium carbonate (TUMS - DOSED IN MG ELEMENTAL CALCIUM) 500 MG chewable tablet Chew 1-2 tablets by mouth at bedtime as needed for indigestion or heartburn.   09/21/2016 at Unknown time  . Prenatal Multivit-Min-Fe-FA (PRENATAL/IRON PO) Take 1 tablet by mouth daily.   09/20/2016 at Unknown time    Review of Systems  Constitutional: Negative.   Gastrointestinal: Negative.    Genitourinary: Positive for vaginal discharge. Negative for vaginal bleeding.   Physical Exam   Dilation: 3.5 Effacement (%): 70 Cervical Position: Posterior Station: -2 Presentation: Vertex Exam by:: cwhite,rnc   Blood pressure 109/70, pulse 94, temperature 97.5 F (36.4 C), temperature source Oral, resp. rate 16, height 5\' 6"  (1.676 m), weight 140 lb 6.4 oz (63.7 kg), last menstrual period 01/18/2016, SpO2 100 %, unknown if currently breastfeeding.  Physical Exam  Nursing note and vitals reviewed. Constitutional: She is oriented to person, place, and time. She appears well-developed and well-nourished. No distress.  HENT:  Head: Normocephalic and atraumatic.  Eyes: Conjunctivae are normal. Right eye exhibits no discharge. Left eye exhibits no discharge. No scleral icterus.  Neck: Normal range of motion.  Respiratory: Effort normal. No respiratory distress.  Neurological: She is alert and oriented to person, place, and time.  Skin: Skin is warm and dry. She is not diaphoretic.  Psychiatric: She has a normal mood and affect. Her behavior is normal. Judgment and thought content normal.   Fetal Tracing:  Baseline: 130 Variability: moderate Accelerations: 15x15 Decelerations: none  Toco: irr ctx MAU Course  Procedures Results for orders placed or performed during the hospital encounter of 09/21/16 (from the past 24 hour(s))  Wet  prep, genital     Status: None   Collection Time: 09/21/16  3:19 PM  Result Value Ref Range   Yeast Wet Prep HPF POC NONE SEEN NONE SEEN   Trich, Wet Prep NONE SEEN NONE SEEN   Clue Cells Wet Prep HPF POC NONE SEEN NONE SEEN   WBC, Wet Prep HPF POC NONE SEEN NONE SEEN   Sperm NONE SEEN   Amnisure rupture of membrane (rom)not at [facility]     Status: None   Collection Time: 09/21/16  3:19 PM  Result Value Ref Range   Amnisure ROM NEGATIVE     MDM Reactive fetal tracing No regular contractions Amnisure negative SVE changed from previous visit  but reports no painful ctx at this time  S/w Dr. Macon Large. Pt received BMZ at [redacted] wks pregnant; will repeat now for late preterm dilation.  Assessment and Plan  :A 1. Vaginal discharge during pregnancy in third trimester   2. Preterm uterine contractions in third trimester, antepartum    P: Discharge home Return to MAU tomorrow for 2nd BMZ Discussed reasons to return to MAU  Judeth Horn 09/21/2016, 5:15 PM

## 2016-09-21 NOTE — MAU Note (Signed)
Pt reports she was here on Monday to see if her waster broke. Amniosure was negative. Pt sent home. After she got home she started having diarrhea and abd pain.  Stil having abe cramping/ on and off. Still having some leaking. Also c/o decreased appetite. Reports fetal movement is less than ususal.

## 2016-09-21 NOTE — Discharge Instructions (Signed)

## 2016-09-22 ENCOUNTER — Encounter (HOSPITAL_COMMUNITY): Payer: Self-pay

## 2016-09-22 ENCOUNTER — Inpatient Hospital Stay (HOSPITAL_COMMUNITY)
Admission: AD | Admit: 2016-09-22 | Discharge: 2016-09-22 | Disposition: A | Discharge status: Home / Self Care | Payer: Medicaid Other | Source: Ambulatory Visit | Attending: Obstetrics & Gynecology | Admitting: Obstetrics & Gynecology

## 2016-09-22 DIAGNOSIS — O479 False labor, unspecified: Secondary | ICD-10-CM | POA: Diagnosis not present

## 2016-09-22 DIAGNOSIS — Z3A36 36 weeks gestation of pregnancy: Secondary | ICD-10-CM | POA: Diagnosis not present

## 2016-09-22 MED ORDER — BETAMETHASONE SOD PHOS & ACET 6 (3-3) MG/ML IJ SUSP
12.0000 mg | Freq: Once | INTRAMUSCULAR | Status: AC
  Administered 2016-09-22: 12 mg via INTRAMUSCULAR
  Filled 2016-09-22: qty 2

## 2016-09-22 NOTE — MAU Note (Signed)
Patient presents to mau for second BMZ. Off and on contractions today. Denies pain at this time. States just sore

## 2016-09-26 LAB — GC/CHLAMYDIA PROBE AMP (~~LOC~~) NOT AT ARMC
Chlamydia: NEGATIVE
NEISSERIA GONORRHEA: NEGATIVE

## 2016-09-27 ENCOUNTER — Ambulatory Visit (INDEPENDENT_AMBULATORY_CARE_PROVIDER_SITE_OTHER): Payer: Medicaid Other | Admitting: Advanced Practice Midwife

## 2016-09-27 VITALS — BP 112/62 | HR 78 | Wt 142.2 lb

## 2016-09-27 DIAGNOSIS — I341 Nonrheumatic mitral (valve) prolapse: Secondary | ICD-10-CM

## 2016-09-27 DIAGNOSIS — O0993 Supervision of high risk pregnancy, unspecified, third trimester: Secondary | ICD-10-CM

## 2016-09-27 NOTE — Progress Notes (Signed)
   PRENATAL VISIT NOTE  Subjective:  Katelyn Fuentes is a 26 y.o. G2P1001 at 5862w5d being seen today for ongoing prenatal care.  She is currently monitored for the following issues for this high-risk pregnancy and has History of postpartum hemorrhage; Anemia affecting pregnancy; SVT (supraventricular tachycardia) (HCC); Mitral valve anterior leaflet prolapse; Supervision of high risk pregnancy, antepartum; Mitral valve prolapse of mother during pregnancy; Preterm labor in third trimester without delivery; Preterm contractions; Sinus arrhythmia; Sinus bradycardia; Anemia; Anxiety; and Depression on her problem list.  Patient reports occasional contractions.  Contractions: Irregular. Vag. Bleeding: None.  Movement: Present. Denies leaking of fluid.   The following portions of the patient's history were reviewed and updated as appropriate: allergies, current medications, past family history, past medical history, past social history, past surgical history and problem list. Problem list updated.  Objective:   Vitals:   09/27/16 0919  BP: 112/62  Pulse: 78  Weight: 142 lb 3.2 oz (64.5 kg)    Fetal Status: Fetal Heart Rate (bpm): 135 Fundal Height: 36 cm Movement: Present  Presentation: Vertex  General:  Alert, oriented and cooperative. Patient is in no acute distress.  Skin: Skin is warm and dry. No rash noted.   Cardiovascular: Normal heart rate noted  Respiratory: Normal respiratory effort, no problems with respiration noted  Abdomen: Soft, gravid, appropriate for gestational age. Pain/Pressure: Present     Pelvic:  Cervical exam performed Dilation: 3      Extremities: Normal range of motion.  Edema: Trace  Mental Status: Normal mood and affect. Normal behavior. Normal judgment and thought content.   Assessment and Plan:  Pregnancy: G2P1001 at 2462w5d  1. Supervision of high risk pregnancy in third trimester      Very sensitive to swab and check, though she requested cervical check -  Culture, beta strep (group b only)  Term labor symptoms and general obstetric precautions including but not limited to vaginal bleeding, contractions, leaking of fluid and fetal movement were reviewed in detail with the patient. Please refer to After Visit Summary for other counseling recommendations.  RTC 1 week  Wynelle BourgeoisMarie Nusaybah Ivie, CNM

## 2016-09-27 NOTE — Patient Instructions (Signed)
Third Trimester of Pregnancy The third trimester is from week 28 through week 40 (months 7 through 9). The third trimester is a time when the unborn baby (fetus) is growing rapidly. At the end of the ninth month, the fetus is about 20 inches in length and weighs 6-10 pounds. Body changes during your third trimester Your body will continue to go through many changes during pregnancy. The changes vary from woman to woman. During the third trimester:  Your weight will continue to increase. You can expect to gain 25-35 pounds (11-16 kg) by the end of the pregnancy.  You may begin to get stretch marks on your hips, abdomen, and breasts.  You may urinate more often because the fetus is moving lower into your pelvis and pressing on your bladder.  You may develop or continue to have heartburn. This is caused by increased hormones that slow down muscles in the digestive tract.  You may develop or continue to have constipation because increased hormones slow digestion and cause the muscles that push waste through your intestines to relax.  You may develop hemorrhoids. These are swollen veins (varicose veins) in the rectum that can itch or be painful.  You may develop swollen, bulging veins (varicose veins) in your legs.  You may have increased body aches in the pelvis, back, or thighs. This is due to weight gain and increased hormones that are relaxing your joints.  You may have changes in your hair. These can include thickening of your hair, rapid growth, and changes in texture. Some women also have hair loss during or after pregnancy, or hair that feels dry or thin. Your hair will most likely return to normal after your baby is born.  Your breasts will continue to grow and they will continue to become tender. A yellow fluid (colostrum) may leak from your breasts. This is the first milk you are producing for your baby.  Your belly button may stick out.  You may notice more swelling in your hands,  face, or ankles.  You may have increased tingling or numbness in your hands, arms, and legs. The skin on your belly may also feel numb.  You may feel short of breath because of your expanding uterus.  You may have more problems sleeping. This can be caused by the size of your belly, increased need to urinate, and an increase in your body's metabolism.  You may notice the fetus "dropping," or moving lower in your abdomen (lightening).  You may have increased vaginal discharge.  You may notice your joints feel loose and you may have pain around your pelvic bone.  What to expect at prenatal visits You will have prenatal exams every 2 weeks until week 36. Then you will have weekly prenatal exams. During a routine prenatal visit:  You will be weighed to make sure you and the baby are growing normally.  Your blood pressure will be taken.  Your abdomen will be measured to track your baby's growth.  The fetal heartbeat will be listened to.  Any test results from the previous visit will be discussed.  You may have a cervical check near your due date to see if your cervix has softened or thinned (effaced).  You will be tested for Group B streptococcus. This happens between 35 and 37 weeks.  Your health care provider may ask you:  What your birth plan is.  How you are feeling.  If you are feeling the baby move.  If you have had   any abnormal symptoms, such as leaking fluid, bleeding, severe headaches, or abdominal cramping.  If you are using any tobacco products, including cigarettes, chewing tobacco, and electronic cigarettes.  If you have any questions.  Other tests or screenings that may be performed during your third trimester include:  Blood tests that check for low iron levels (anemia).  Fetal testing to check the health, activity level, and growth of the fetus. Testing is done if you have certain medical conditions or if there are problems during the  pregnancy.  Nonstress test (NST). This test checks the health of your baby to make sure there are no signs of problems, such as the baby not getting enough oxygen. During this test, a belt is placed around your belly. The baby is made to move, and its heart rate is monitored during movement.  What is false labor? False labor is a condition in which you feel small, irregular tightenings of the muscles in the womb (contractions) that usually go away with rest, changing position, or drinking water. These are called Braxton Hicks contractions. Contractions may last for hours, days, or even weeks before true labor sets in. If contractions come at regular intervals, become more frequent, increase in intensity, or become painful, you should see your health care provider. What are the signs of labor?  Abdominal cramps.  Regular contractions that start at 10 minutes apart and become stronger and more frequent with time.  Contractions that start on the top of the uterus and spread down to the lower abdomen and back.  Increased pelvic pressure and dull back pain.  A watery or bloody mucus discharge that comes from the vagina.  Leaking of amniotic fluid. This is also known as your "water breaking." It could be a slow trickle or a gush. Let your health care provider know if it has a color or strange odor. If you have any of these signs, call your health care provider right away, even if it is before your due date. Follow these instructions at home: Medicines  Follow your health care provider's instructions regarding medicine use. Specific medicines may be either safe or unsafe to take during pregnancy.  Take a prenatal vitamin that contains at least 600 micrograms (mcg) of folic acid.  If you develop constipation, try taking a stool softener if your health care provider approves. Eating and drinking  Eat a balanced diet that includes fresh fruits and vegetables, whole grains, good sources of protein  such as meat, eggs, or tofu, and low-fat dairy. Your health care provider will help you determine the amount of weight gain that is right for you.  Avoid raw meat and uncooked cheese. These carry germs that can cause birth defects in the baby.  If you have low calcium intake from food, talk to your health care provider about whether you should take a daily calcium supplement.  Eat four or five small meals rather than three large meals a day.  Limit foods that are high in fat and processed sugars, such as fried and sweet foods.  To prevent constipation: ? Drink enough fluid to keep your urine clear or pale yellow. ? Eat foods that are high in fiber, such as fresh fruits and vegetables, whole grains, and beans. Activity  Exercise only as directed by your health care provider. Most women can continue their usual exercise routine during pregnancy. Try to exercise for 30 minutes at least 5 days a week. Stop exercising if you experience uterine contractions.  Avoid heavy   lifting.  Do not exercise in extreme heat or humidity, or at high altitudes.  Wear low-heel, comfortable shoes.  Practice good posture.  You may continue to have sex unless your health care provider tells you otherwise. Relieving pain and discomfort  Take frequent breaks and rest with your legs elevated if you have leg cramps or low back pain.  Take warm sitz baths to soothe any pain or discomfort caused by hemorrhoids. Use hemorrhoid cream if your health care provider approves.  Wear a good support bra to prevent discomfort from breast tenderness.  If you develop varicose veins: ? Wear support pantyhose or compression stockings as told by your healthcare provider. ? Elevate your feet for 15 minutes, 3-4 times a day. Prenatal care  Write down your questions. Take them to your prenatal visits.  Keep all your prenatal visits as told by your health care provider. This is important. Safety  Wear your seat belt at  all times when driving.  Make a list of emergency phone numbers, including numbers for family, friends, the hospital, and police and fire departments. General instructions  Avoid cat litter boxes and soil used by cats. These carry germs that can cause birth defects in the baby. If you have a cat, ask someone to clean the litter box for you.  Do not travel far distances unless it is absolutely necessary and only with the approval of your health care provider.  Do not use hot tubs, steam rooms, or saunas.  Do not drink alcohol.  Do not use any products that contain nicotine or tobacco, such as cigarettes and e-cigarettes. If you need help quitting, ask your health care provider.  Do not use any medicinal herbs or unprescribed drugs. These chemicals affect the formation and growth of the baby.  Do not douche or use tampons or scented sanitary pads.  Do not cross your legs for long periods of time.  To prepare for the arrival of your baby: ? Take prenatal classes to understand, practice, and ask questions about labor and delivery. ? Make a trial run to the hospital. ? Visit the hospital and tour the maternity area. ? Arrange for maternity or paternity leave through employers. ? Arrange for family and friends to take care of pets while you are in the hospital. ? Purchase a rear-facing car seat and make sure you know how to install it in your car. ? Pack your hospital bag. ? Prepare the baby's nursery. Make sure to remove all pillows and stuffed animals from the baby's crib to prevent suffocation.  Visit your dentist if you have not gone during your pregnancy. Use a soft toothbrush to brush your teeth and be gentle when you floss. Contact a health care provider if:  You are unsure if you are in labor or if your water has broken.  You become dizzy.  You have mild pelvic cramps, pelvic pressure, or nagging pain in your abdominal area.  You have lower back pain.  You have persistent  nausea, vomiting, or diarrhea.  You have an unusual or bad smelling vaginal discharge.  You have pain when you urinate. Get help right away if:  Your water breaks before 37 weeks.  You have regular contractions less than 5 minutes apart before 37 weeks.  You have a fever.  You are leaking fluid from your vagina.  You have spotting or bleeding from your vagina.  You have severe abdominal pain or cramping.  You have rapid weight loss or weight gain.    You have shortness of breath with chest pain.  You notice sudden or extreme swelling of your face, hands, ankles, feet, or legs.  Your baby makes fewer than 10 movements in 2 hours.  You have severe headaches that do not go away when you take medicine.  You have vision changes. Summary  The third trimester is from week 28 through week 40, months 7 through 9. The third trimester is a time when the unborn baby (fetus) is growing rapidly.  During the third trimester, your discomfort may increase as you and your baby continue to gain weight. You may have abdominal, leg, and back pain, sleeping problems, and an increased need to urinate.  During the third trimester your breasts will keep growing and they will continue to become tender. A yellow fluid (colostrum) may leak from your breasts. This is the first milk you are producing for your baby.  False labor is a condition in which you feel small, irregular tightenings of the muscles in the womb (contractions) that eventually go away. These are called Braxton Hicks contractions. Contractions may last for hours, days, or even weeks before true labor sets in.  Signs of labor can include: abdominal cramps; regular contractions that start at 10 minutes apart and become stronger and more frequent with time; watery or bloody mucus discharge that comes from the vagina; increased pelvic pressure and dull back pain; and leaking of amniotic fluid. This information is not intended to replace advice  given to you by your health care provider. Make sure you discuss any questions you have with your health care provider. Document Released: 04/11/2001 Document Revised: 09/23/2015 Document Reviewed: 06/18/2012 Elsevier Interactive Patient Education  2017 [employer]  

## 2016-09-29 ENCOUNTER — Inpatient Hospital Stay (HOSPITAL_COMMUNITY): Payer: Medicaid Other | Admitting: Anesthesiology

## 2016-09-29 ENCOUNTER — Encounter (HOSPITAL_COMMUNITY): Payer: Self-pay | Admitting: *Deleted

## 2016-09-29 ENCOUNTER — Inpatient Hospital Stay (HOSPITAL_COMMUNITY)
Admission: AD | Admit: 2016-09-29 | Discharge: 2016-10-01 | DRG: 774 | Disposition: A | Discharge status: Home / Self Care | Payer: Medicaid Other | Source: Ambulatory Visit | Attending: Obstetrics and Gynecology | Admitting: Obstetrics and Gynecology

## 2016-09-29 DIAGNOSIS — Z3A37 37 weeks gestation of pregnancy: Secondary | ICD-10-CM

## 2016-09-29 DIAGNOSIS — O4202 Full-term premature rupture of membranes, onset of labor within 24 hours of rupture: Secondary | ICD-10-CM

## 2016-09-29 DIAGNOSIS — O9942 Diseases of the circulatory system complicating childbirth: Secondary | ICD-10-CM | POA: Diagnosis present

## 2016-09-29 DIAGNOSIS — O0990 Supervision of high risk pregnancy, unspecified, unspecified trimester: Secondary | ICD-10-CM

## 2016-09-29 DIAGNOSIS — O99419 Diseases of the circulatory system complicating pregnancy, unspecified trimester: Secondary | ICD-10-CM

## 2016-09-29 DIAGNOSIS — I341 Nonrheumatic mitral (valve) prolapse: Secondary | ICD-10-CM | POA: Diagnosis present

## 2016-09-29 DIAGNOSIS — Z3493 Encounter for supervision of normal pregnancy, unspecified, third trimester: Secondary | ICD-10-CM | POA: Diagnosis present

## 2016-09-29 LAB — CBC
HCT: 27.7 % — ABNORMAL LOW (ref 36.0–46.0)
Hemoglobin: 9.1 g/dL — ABNORMAL LOW (ref 12.0–15.0)
MCH: 25.1 pg — AB (ref 26.0–34.0)
MCHC: 32.9 g/dL (ref 30.0–36.0)
MCV: 76.5 fL — ABNORMAL LOW (ref 78.0–100.0)
PLATELETS: 242 10*3/uL (ref 150–400)
RBC: 3.62 MIL/uL — ABNORMAL LOW (ref 3.87–5.11)
RDW: 14.6 % (ref 11.5–15.5)
WBC: 8.6 10*3/uL (ref 4.0–10.5)

## 2016-09-29 MED ORDER — EPHEDRINE 5 MG/ML INJ
10.0000 mg | INTRAVENOUS | Status: DC | PRN
  Filled 2016-09-29: qty 2

## 2016-09-29 MED ORDER — WITCH HAZEL-GLYCERIN EX PADS: 1.0000 "application " | MEDICATED_PAD | CUTANEOUS | Status: DC | PRN

## 2016-09-29 MED ORDER — PHENYLEPHRINE 40 MCG/ML (10ML) SYRINGE FOR IV PUSH (FOR BLOOD PRESSURE SUPPORT)
80.0000 ug | PREFILLED_SYRINGE | INTRAVENOUS | Status: DC | PRN
  Filled 2016-09-29: qty 5
  Filled 2016-09-29: qty 10

## 2016-09-29 MED ORDER — BENZOCAINE-MENTHOL 20-0.5 % EX AERO
1.0000 "application " | INHALATION_SPRAY | CUTANEOUS | Status: DC | PRN
  Administered 2016-09-29: 1 via TOPICAL
  Filled 2016-09-29: qty 56

## 2016-09-29 MED ORDER — HYDROXYZINE HCL 50 MG PO TABS
50.0000 mg | ORAL_TABLET | Freq: Four times a day (QID) | ORAL | Status: DC | PRN
  Filled 2016-09-29: qty 1

## 2016-09-29 MED ORDER — DIBUCAINE 1 % RE OINT: 1.0000 "application " | TOPICAL_OINTMENT | RECTAL | Status: DC | PRN

## 2016-09-29 MED ORDER — SODIUM CHLORIDE 0.9 % IV SOLN: 250.0000 mL | INTRAVENOUS | Status: DC | PRN

## 2016-09-29 MED ORDER — COCONUT OIL OIL: 1.0000 "application " | TOPICAL_OIL | Status: DC | PRN

## 2016-09-29 MED ORDER — PRENATAL MULTIVITAMIN CH
1.0000 | ORAL_TABLET | Freq: Every day | ORAL | Status: DC
  Administered 2016-09-30 – 2016-10-01 (×2): 1 via ORAL
  Filled 2016-09-29 (×2): qty 1

## 2016-09-29 MED ORDER — ONDANSETRON HCL 4 MG/2ML IJ SOLN: 4.0000 mg | Freq: Four times a day (QID) | INTRAMUSCULAR | Status: DC | PRN

## 2016-09-29 MED ORDER — FLEET ENEMA 7-19 GM/118ML RE ENEM: 1.0000 | ENEMA | Freq: Every day | RECTAL | Status: DC | PRN

## 2016-09-29 MED ORDER — SODIUM CHLORIDE 0.9% FLUSH: 3.0000 mL | Freq: Two times a day (BID) | INTRAVENOUS | Status: DC

## 2016-09-29 MED ORDER — OXYTOCIN BOLUS FROM INFUSION
500.0000 mL | Freq: Once | INTRAVENOUS | Status: AC
  Administered 2016-09-29: 500 mL via INTRAVENOUS

## 2016-09-29 MED ORDER — FENTANYL CITRATE (PF) 100 MCG/2ML IJ SOLN
INTRAMUSCULAR | Status: AC
  Filled 2016-09-29: qty 2

## 2016-09-29 MED ORDER — SENNOSIDES-DOCUSATE SODIUM 8.6-50 MG PO TABS
2.0000 | ORAL_TABLET | ORAL | Status: DC
  Administered 2016-09-29 – 2016-09-30 (×2): 2 via ORAL
  Filled 2016-09-29 (×2): qty 2

## 2016-09-29 MED ORDER — OXYCODONE-ACETAMINOPHEN 5-325 MG PO TABS: 2.0000 | ORAL_TABLET | ORAL | Status: DC | PRN

## 2016-09-29 MED ORDER — DIPHENHYDRAMINE HCL 50 MG/ML IJ SOLN: 12.5000 mg | INTRAMUSCULAR | Status: DC | PRN

## 2016-09-29 MED ORDER — SODIUM CHLORIDE 0.9% FLUSH: 3.0000 mL | INTRAVENOUS | Status: DC | PRN

## 2016-09-29 MED ORDER — IBUPROFEN 600 MG PO TABS
600.0000 mg | ORAL_TABLET | Freq: Four times a day (QID) | ORAL | Status: DC
  Administered 2016-09-29 – 2016-10-01 (×7): 600 mg via ORAL
  Filled 2016-09-29 (×7): qty 1

## 2016-09-29 MED ORDER — PHENYLEPHRINE 40 MCG/ML (10ML) SYRINGE FOR IV PUSH (FOR BLOOD PRESSURE SUPPORT)
80.0000 ug | PREFILLED_SYRINGE | INTRAVENOUS | Status: DC | PRN
  Filled 2016-09-29: qty 5

## 2016-09-29 MED ORDER — LACTATED RINGERS IV SOLN: 500.0000 mL | INTRAVENOUS | Status: DC | PRN

## 2016-09-29 MED ORDER — OXYCODONE-ACETAMINOPHEN 5-325 MG PO TABS: 1.0000 | ORAL_TABLET | ORAL | Status: DC | PRN

## 2016-09-29 MED ORDER — ONDANSETRON HCL 4 MG PO TABS: 4.0000 mg | ORAL_TABLET | ORAL | Status: DC | PRN

## 2016-09-29 MED ORDER — MISOPROSTOL 200 MCG PO TABS
ORAL_TABLET | ORAL | Status: AC
  Filled 2016-09-29: qty 4

## 2016-09-29 MED ORDER — LIDOCAINE HCL (PF) 1 % IJ SOLN
INTRAMUSCULAR | Status: AC
  Filled 2016-09-29: qty 30

## 2016-09-29 MED ORDER — TETANUS-DIPHTH-ACELL PERTUSSIS 5-2.5-18.5 LF-MCG/0.5 IM SUSP: 0.5000 mL | Freq: Once | INTRAMUSCULAR | Status: DC

## 2016-09-29 MED ORDER — OXYTOCIN 40 UNITS IN LACTATED RINGERS INFUSION - SIMPLE MED
2.5000 [IU]/h | INTRAVENOUS | Status: DC
  Administered 2016-09-29: 2.5 [IU]/h via INTRAVENOUS

## 2016-09-29 MED ORDER — ZOLPIDEM TARTRATE 5 MG PO TABS: 5.0000 mg | ORAL_TABLET | Freq: Every evening | ORAL | Status: DC | PRN

## 2016-09-29 MED ORDER — FENTANYL CITRATE (PF) 100 MCG/2ML IJ SOLN
50.0000 ug | INTRAMUSCULAR | Status: DC | PRN
  Administered 2016-09-29: 50 ug via INTRAVENOUS

## 2016-09-29 MED ORDER — OXYTOCIN 40 UNITS IN LACTATED RINGERS INFUSION - SIMPLE MED: 2.5000 [IU]/h | INTRAVENOUS | Status: DC | PRN

## 2016-09-29 MED ORDER — MISOPROSTOL 200 MCG PO TABS
400.0000 ug | ORAL_TABLET | Freq: Once | ORAL | Status: AC
  Administered 2016-09-29: 400 ug via RECTAL

## 2016-09-29 MED ORDER — ACETAMINOPHEN 325 MG PO TABS: 650.0000 mg | ORAL_TABLET | ORAL | Status: DC | PRN

## 2016-09-29 MED ORDER — ONDANSETRON HCL 4 MG/2ML IJ SOLN: 4.0000 mg | INTRAMUSCULAR | Status: DC | PRN

## 2016-09-29 MED ORDER — DIPHENHYDRAMINE HCL 25 MG PO CAPS: 25.0000 mg | ORAL_CAPSULE | Freq: Four times a day (QID) | ORAL | Status: DC | PRN

## 2016-09-29 MED ORDER — LIDOCAINE HCL (PF) 1 % IJ SOLN
INTRAMUSCULAR | Status: DC | PRN
  Administered 2016-09-29 (×2): 5 mL via EPIDURAL

## 2016-09-29 MED ORDER — OXYTOCIN 40 UNITS IN LACTATED RINGERS INFUSION - SIMPLE MED
INTRAVENOUS | Status: AC
  Administered 2016-09-29: 500 mL via INTRAVENOUS
  Filled 2016-09-29: qty 1000

## 2016-09-29 MED ORDER — LACTATED RINGERS IV SOLN
500.0000 mL | Freq: Once | INTRAVENOUS | Status: AC
  Administered 2016-09-29: 500 mL via INTRAVENOUS

## 2016-09-29 MED ORDER — FENTANYL 2.5 MCG/ML BUPIVACAINE 1/10 % EPIDURAL INFUSION (WH - ANES)
14.0000 mL/h | INTRAMUSCULAR | Status: DC | PRN
  Administered 2016-09-29: 14 mL/h via EPIDURAL
  Filled 2016-09-29: qty 100

## 2016-09-29 MED ORDER — LIDOCAINE HCL (PF) 1 % IJ SOLN
30.0000 mL | INTRAMUSCULAR | Status: DC | PRN
  Administered 2016-09-29: 30 mL via SUBCUTANEOUS
  Filled 2016-09-29: qty 30

## 2016-09-29 MED ORDER — LACTATED RINGERS IV SOLN
INTRAVENOUS | Status: DC
  Administered 2016-09-29: 17:00:00 via INTRAVENOUS

## 2016-09-29 MED ORDER — SOD CITRATE-CITRIC ACID 500-334 MG/5ML PO SOLN: 30.0000 mL | ORAL | Status: DC | PRN

## 2016-09-29 MED ORDER — SIMETHICONE 80 MG PO CHEW: 80.0000 mg | CHEWABLE_TABLET | ORAL | Status: DC | PRN

## 2016-09-29 NOTE — MAU Note (Signed)
Water broke at 1555, brownish d/c.  Contractions started after srom .   Was 3cm when last checked.

## 2016-09-29 NOTE — Anesthesia Preprocedure Evaluation (Signed)
Anesthesia Evaluation  Patient identified by MRN, date of birth, ID band Patient awake    Reviewed: Allergy & Precautions, H&P , NPO status , Patient's Chart, lab work & pertinent test results, reviewed documented beta blocker date and time   Airway Mallampati: I  TM Distance: >3 FB Neck ROM: full    Dental no notable dental hx.    Pulmonary neg pulmonary ROS,    Pulmonary exam normal breath sounds clear to auscultation       Cardiovascular negative cardio ROS Normal cardiovascular exam Rhythm:regular Rate:Normal     Neuro/Psych negative neurological ROS  negative psych ROS   GI/Hepatic negative GI ROS, Neg liver ROS,   Endo/Other  negative endocrine ROS  Renal/GU negative Renal ROS  negative genitourinary   Musculoskeletal   Abdominal   Peds  Hematology negative hematology ROS (+)   Anesthesia Other Findings   Reproductive/Obstetrics (+) Pregnancy                             Anesthesia Physical Anesthesia Plan  ASA: II  Anesthesia Plan: Epidural   Post-op Pain Management:    Induction:   Airway Management Planned:   Additional Equipment:   Intra-op Plan:   Post-operative Plan:   Informed Consent: I have reviewed the patients History and Physical, chart, labs and discussed the procedure including the risks, benefits and alternatives for the proposed anesthesia with the patient or authorized representative who has indicated his/her understanding and acceptance.     Plan Discussed with:   Anesthesia Plan Comments:         Anesthesia Quick Evaluation  

## 2016-09-29 NOTE — Lactation Note (Signed)
This note was copied from a baby's chart. Lactation Consultation Note  Patient Name: Katelyn Fuentes UJWJX'BToday's Date: 09/29/2016  [facility] reports plans to [facility].    Maternal Data    Feeding    LATCH Score/Interventions                      Lactation Tools Discussed/Used     Consult Status      [name], [name] 09/29/2016, 8:07 PM

## 2016-09-29 NOTE — Anesthesia Procedure Notes (Signed)
Epidural Patient location during procedure: OB Start time: 09/29/2016 5:10 PM End time: 09/29/2016 5:15 PM  Staffing Anesthesiologist: Charmain Diosdado  Preanesthetic Checklist Completed: patient identified, site marked, surgical consent, pre-op evaluation, timeout performed, IV checked, risks and benefits discussed and monitors and equipment checked  Epidural Patient position: sitting Prep: site prepped and draped and DuraPrep Patient monitoring: continuous pulse ox and blood pressure Approach: midline Location: L4-L5 Injection technique: LOR air  Needle:  Needle type: Tuohy  Needle gauge: 17 G Needle length: 9 cm and 9 Needle insertion depth: 5 cm cm Catheter type: closed end flexible Catheter size: 19 Gauge Catheter at skin depth: 10 cm Test dose: negative  Assessment Sensory level: T10 Events: blood not aspirated, injection not painful, no injection resistance, negative IV test and no paresthesia

## 2016-09-29 NOTE — H&P (Signed)
LABOR ADMISSION HISTORY AND PHYSICAL  Katelyn Fuentes is a 26 y.o. female G2P1001 with IUP at 354w0d by 12wk US presenting with SOL; SROM occurred at approximately 3:45 and was clear. She reports +FM, + contractions, No LOF, no VB, no blurry vision, headaches or peripheral edema, and RUQ pain.  She plans on bottle feeding. She is undecided for birth control.  Dating: By Sung Amabile12wk US --->  Estimated Date of Delivery: 10/20/16  Past Medical History: Past Medical History:  Diagnosis Date  . Anemia   . Asthma    childhood  . Mitral valve prolapse   . Postpartum hemorrhage   . Preterm labor   . Sinus arrhythmia   . Sinus bradycardia   . SVT (supraventricular tachycardia) (HCC)     Past Surgical History: Past Surgical History:  Procedure Laterality Date  . NO PAST SURGERIES      Obstetrical History: OB History    Gravida Para Term Preterm AB Living   2 1 1  0 0 1   SAB TAB Ectopic Multiple Live Births   0 0 0 0 1     Social History: Social History   Social History  . Marital status: Married    Spouse name: N/A  . Number of children: N/A  . Years of education: N/A   Social History Main Topics  . Smoking status: Never Smoker  . Smokeless tobacco: Never Used  . Alcohol use Yes     Comment: occasional  . Drug use: No  . Sexual activity: Yes   Other Topics Concern  . None   Social History Narrative  . None   Family History: Family History  Problem Relation Age of Onset  . Heart disease Mother   . Hypertension Mother   . Heart disease Maternal Grandmother   . Hypertension Maternal Grandmother   . Diabetes Neg Hx   . Heart failure Neg Hx   . Hyperlipidemia Neg Hx    Allergies: Allergies  Allergen Reactions  . Peach Seed Hives    peaches    Prescriptions Prior to Admission  Medication Sig Dispense Refill Last Dose  . calcium carbonate (TUMS - DOSED IN MG ELEMENTAL CALCIUM) 500 MG chewable tablet Chew 1-2 tablets by mouth at bedtime as needed for indigestion or  heartburn.   Past Week at Unknown time  . Prenatal Multivit-Min-Fe-FA (PRENATAL/IRON PO) Take 1 tablet by mouth daily.   Past Week at Unknown time   Review of Systems   All systems reviewed and negative except as stated in HPI  BP 117/75   Pulse 79   Ht 5\' 6"  (1.676 m)   Wt 64.5 kg (142 lb 3.2 oz)   LMP 01/18/2016 (Exact Date)   SpO2 100%   BMI 22.95 kg/m  General appearance: alert, cooperative, appears stated age and no distress Lungs: clear to auscultation bilaterally Heart: regular rate and rhythm Abdomen: soft, non-tender; bowel sounds normal Extremities: Homans sign is negative, no sign of DVT, edema Fetal monitoringBaseline: 120 bpm, Variability: Good {> 6 bpm), Accelerations: Reactive and Decelerations: Absent Uterine activity: every 2-3 min  Dilation: 7 Effacement (%): 90 Station: -1 Exam by:: morris rn  Prenatal labs: ABO, Rh: --/--/B POS (04/05 1500) Antibody: NEG (04/05 1458) Rubella: Immune RPR: Non Reactive (04/30 0752)  HBsAg: NEGATIVE (12/12 0857)  HIV: Non Reactive (04/30 0752)  GBS: unknown - pending 1 hr Glucola: 141 Genetic screening: normal Anatomy US: normal  Prenatal Transfer Tool  Maternal Diabetes: No Genetic Screening: Normal Maternal  Ultrasounds/Referrals: Normal Fetal Ultrasounds or other Referrals:  None Maternal Substance Abuse:  No Significant Maternal Medications:  None Significant Maternal Lab Results: None  Results for orders placed or performed during the hospital encounter of 09/29/16 (from the past 24 hour(s))  CBC   Collection Time: 09/29/16  4:44 PM  Result Value Ref Range   WBC 8.6 4.0 - 10.5 K/uL   RBC 3.62 (L) 3.87 - 5.11 MIL/uL   Hemoglobin 9.1 (L) 12.0 - 15.0 g/dL   HCT 16.1 (L) 09.6 - 04.5 %   MCV 76.5 (L) 78.0 - 100.0 fL   MCH 25.1 (L) 26.0 - 34.0 pg   MCHC 32.9 30.0 - 36.0 g/dL   RDW 40.9 81.1 - 91.4 %   Platelets 242 150 - 400 K/uL    Patient Active Problem List   Diagnosis Date Noted  . Labor and  delivery, indication for care 09/29/2016  . Sinus arrhythmia   . Sinus bradycardia   . Anemia   . Preterm labor in third trimester without delivery 08/03/2016  . Preterm contractions 08/03/2016  . Supervision of high risk pregnancy, antepartum 04/11/2016  . Mitral valve prolapse of mother during pregnancy 04/11/2016  . Mitral valve anterior leaflet prolapse 12/09/2014  . SVT (supraventricular tachycardia) (HCC) 12/08/2014  . Anxiety 12/17/2012  . Depression 12/17/2012  . Anemia affecting pregnancy 04/14/2011  . History of postpartum hemorrhage 04/12/2011    Assessment: Katelyn Fuentes is a 26 y.o. G2P1001 at [redacted]w[redacted]d here for SOL.   #Labor: Expectant management; consider augmentation as needed #Pain:  Epidural   #FWB:  Category I #ID:   GBS unknown - pending  #MOF:  Bottle #MOC:  OCPs/NuvaRing #Circ:   No  [name], MS3   OB FELLOW HISTORY AND PHYSICAL ATTESTATION  I have seen and examined this patient independently of the medical student; I agree with above documentation in the student's note. I performed the physical exam myself.    Jen Mow, DO OB Fellow 09/29/2016, 10:02 PM

## 2016-09-30 LAB — RPR: RPR: NONREACTIVE

## 2016-09-30 NOTE — Anesthesia Postprocedure Evaluation (Signed)
Anesthesia Post Note  Patient: Katelyn Fuentes  Procedure(s) Performed: * No procedures listed *     Patient location during evaluation: Mother Baby Anesthesia Type: Epidural Level of consciousness: awake and alert, oriented and patient cooperative Pain management: pain level controlled Vital Signs Assessment: post-procedure vital signs reviewed and stable Respiratory status: spontaneous breathing Cardiovascular status: stable Postop Assessment: no headache, epidural receding, patient able to bend at knees and no signs of nausea or vomiting Anesthetic complications: no Comments: Pain score 0.    Last Vitals:  Vitals:   09/30/16 0531 09/30/16 0738  BP: (!) 109/53 (!) 104/55  Pulse: 73 71  Resp: 18 18  Temp: 36.8 C 37 C    Last Pain:  Vitals:   09/30/16 0739  TempSrc:   PainSc: 1    Pain Goal: Patients Stated Pain Goal: 3 (09/30/16 0739)               Merrilyn PumaWRINKLE,Harrington Jobe

## 2016-09-30 NOTE — Progress Notes (Signed)
MOB was referred for history of depression/anxiety.  Referral is screened out by Clinical Social Worker because none of the following criteria appear to apply and there are no reports impacting the pregnancy or her transition to the postpartum period.  CSW does not deem it clinically necessary to further investigate at this time.   -History of anxiety/depression during this pregnancy, or of post-partum depression - Diagnosis of anxiety and/or depression within last 3 years  - History of depression due to pregnancy loss/loss of child or -MOB's symptoms are currently being treated with medication and/or therapy.  CSW met with MOB at bedside to assess consult regarding MOB's hx of anxiety /depression. Upon this writers arrival, MOB noted to this Probation officer that she dealt with that 5+ years ago and it was more of PTSD versus actual anxiety/depression. MOB denies the need for further assessment at this time. Please contact the Clinical Social Worker if needs arise or upon MOB request.    Oda Cogan, MSW, [facility]  Office: [phone]

## 2016-09-30 NOTE — Progress Notes (Signed)
Patient ID: Katelyn Fuentes, female   DOB: Jan 31, 1991, 26 y.o.   MRN: [id number]  POSTPARTUM PROGRESS NOTE  Post Partum Day #1 Subjective:  Katelyn Fuentes is a 26 y.o. [id number] 3952w0d s/p NSVD.  No acute events overnight.  Pt denies problems with ambulating, voiding or po intake.  She denies nausea or vomiting.  Pain is well controlled.  She has had flatus. She has not had bowel movement.  Lochia Small.   Objective: Blood pressure (!) 104/55, pulse 71, temperature 98.6 F (37 C), temperature source Oral, resp. rate 18, height 5\' 6"  (1.676 m), weight 142 lb 3.2 oz (64.5 kg), last menstrual period 01/18/2016, SpO2 98 %, unknown if currently breastfeeding.  Physical Exam:  General: alert, cooperative and no distress Lochia:normal flow Chest: CTAB Heart: RRR no m/r/g Abdomen: +BS, soft, nontender,  Uterine Fundus: firm, below umbilicus DVT Evaluation: No calf swelling or tenderness Extremities: No edema   Recent Labs  09/29/16 1644  HGB 9.1*  HCT 27.7*    Assessment/Plan:  ASSESSMENT: Katelyn Fuentes is a 26 y.o. [id number] 752w0d s/p NSVD.  Plan for discharge tomorrow and Contraception Nuvaring   LOS: 1 day   Jen MowElizabeth Andriana Casa, DO OB Fellow [facility] 09/30/2016, 11:49 AM

## 2016-10-01 LAB — CULTURE, BETA STREP (GROUP B ONLY): Strep Gp B Culture: NEGATIVE

## 2016-10-01 MED ORDER — IBUPROFEN 600 MG PO TABS: 600.0000 mg | ORAL_TABLET | Freq: Four times a day (QID) | ORAL | 0 refills | Status: DC

## 2016-10-01 NOTE — Discharge Summary (Signed)
OB Discharge Summary     Patient Name: Katelyn Fuentes DOB: [birth year] MRN: [id number]  Date of admission: 09/29/2016 Delivering MD: Michaele Offer   Date of discharge: 10/01/2016  Admitting diagnosis: 37WKS CTX Intrauterine pregnancy: [redacted]w[redacted]d     Secondary diagnosis:  Active Problems:   Labor and delivery, indication for care  Additional problems: MVP but no regurg, no precautions for labor     Discharge diagnosis: Term Pregnancy Delivered                                                                                                Post partum procedures:none  Augmentation: N/a  Complications: None  Hospital course:  Onset of Labor With Vaginal Delivery     26 y.o. yo [id number] at [redacted]w[redacted]d was admitted in Active Labor on 09/29/2016. Patient had an uncomplicated labor course as follows:  Membrane Rupture Time/Date: 3:45 PM ,09/29/2016   Intrapartum Procedures: Episiotomy: None [1]                                         Lacerations:  Labial [10]  Patient had a delivery of a Viable infant. 09/29/2016  Information for the patient's newborn:  Katelyn Fuentes [[id number]]       [name] had an uncomplicated postpartum course.  She is ambulating, tolerating a regular diet, passing flatus, and urinating well. Patient is discharged home in stable condition on 10/01/16.   Physical exam  Vitals:   09/30/16 0531 09/30/16 0738 09/30/16 1816 10/01/16 0552  BP: (!) 109/53 (!) 104/55 (!) 105/54 (!) 97/45  Pulse: 73 71 61 60  Resp: 18 18 18 18   Temp: 98.3 F (36.8 C) 98.6 F (37 C) 98.3 F (36.8 C) 98.4 F (36.9 C)  TempSrc: Oral Oral Oral Oral  SpO2:  98%    Weight:      Height:       General: alert, cooperative and no distress Lochia: appropriate Uterine Fundus: firm Incision: Healing well with no significant drainage DVT Evaluation: No evidence of DVT seen on physical exam. Labs: Lab Results  Component Value Date   WBC 8.6 09/29/2016   HGB 9.1 (L) 09/29/2016   HCT  27.7 (L) 09/29/2016   MCV 76.5 (L) 09/29/2016   PLT 242 09/29/2016   CMP Latest Ref Rng & Units 11/12/2015  Glucose 65 - 99 mg/dL 657(Q)  BUN 6 - 20 mg/dL 6  Creatinine 4.69 - 6.29 mg/dL 5.28  Sodium 413 - 244 mmol/L 137  Potassium 3.5 - 5.1 mmol/L 3.6  Chloride 101 - 111 mmol/L 102  CO2 22 - 32 mmol/L 26  Calcium 8.9 - 10.3 mg/dL 9.2  Total Protein 6.5 - 8.1 g/dL 6.7  Total Bilirubin 0.3 - 1.2 mg/dL 0.4  Alkaline Phos 38 - 126 U/L 36(L)  AST 15 - 41 U/L 20  ALT 14 - 54 U/L 15    Discharge instruction: per After Visit Summary and "Baby and Me Booklet".  After visit meds:  Allergies  as of 10/01/2016      Reactions   Peach Seed Hives   peaches      Medication List    TAKE these medications   calcium carbonate 500 MG chewable tablet Commonly known as:  TUMS - dosed in mg elemental calcium Chew 1-2 tablets by mouth at bedtime as needed for indigestion or heartburn.   ibuprofen 600 MG tablet Commonly known as:  ADVIL,MOTRIN Take 1 tablet (600 mg total) by mouth every 6 (six) hours.   PRENATAL/IRON PO Take 1 tablet by mouth daily.       Diet: routine diet  Activity: Advance as tolerated. Pelvic rest for 6 weeks.   Outpatient follow up:6 weeks Follow up Appt:Future Appointments Date Time Provider Department Center  10/04/2016 12:40 PM [facility]henk[facility]  10/12/2016 8:40 AM Anyanwu, Jethro BastosUgonna A, MD [facility]  10/18/2016 10:00 AM Anyanwu, Jethro BastosUgonna A, MD [facility]   Follow up Visit:No Follow-up on file.  Postpartum contraception: Nuvaring  Newborn Data: Live born female  Birth Weight: 6 lb 12.5 oz (3075 g) APGAR: 9, 9  Baby Feeding: Bottle Disposition:home with mother   10/01/2016 Sharen CounterLisa Leftwich-Kirby, CNM

## 2016-10-04 ENCOUNTER — Encounter: Payer: Medicaid Other | Admitting: Obstetrics and Gynecology

## 2016-10-05 ENCOUNTER — Telehealth: Payer: Self-pay | Admitting: *Deleted

## 2016-10-05 NOTE — Telephone Encounter (Signed)
Patient called about getting a breast pump on 6/5. Returned her call. Patient stated she has pregnancy medicaid. Unfortunately this will not cover a breast pump. I advised patient that if she hasn't already done so to [facility]. Patient voiced understanding.

## 2016-10-12 ENCOUNTER — Encounter: Payer: Medicaid Other | Admitting: Obstetrics & Gynecology

## 2016-10-18 ENCOUNTER — Encounter: Payer: Medicaid Other | Admitting: Obstetrics & Gynecology

## 2016-10-31 ENCOUNTER — Ambulatory Visit (INDEPENDENT_AMBULATORY_CARE_PROVIDER_SITE_OTHER): Payer: Medicaid Other | Admitting: Medical

## 2016-10-31 ENCOUNTER — Encounter: Payer: Self-pay | Admitting: Medical

## 2016-10-31 DIAGNOSIS — Z3009 Encounter for other general counseling and advice on contraception: Secondary | ICD-10-CM

## 2016-10-31 MED ORDER — NORETHINDRONE 0.35 MG PO TABS: 1.0000 | ORAL_TABLET | Freq: Every day | ORAL | 11 refills | Status: DC

## 2016-10-31 NOTE — Patient Instructions (Signed)
Oral Contraception Information Oral contraceptive pills (OCPs) are medicines taken to prevent pregnancy. OCPs work by preventing the ovaries from releasing eggs. The hormones in OCPs also cause the cervical mucus to thicken, preventing the sperm from entering the uterus. The hormones also cause the uterine lining to become thin, not allowing a fertilized egg to attach to the inside of the uterus. OCPs are highly effective when taken exactly as prescribed. However, OCPs do not prevent sexually transmitted diseases (STDs). Safe sex practices, such as using condoms along with the pill, can help prevent STDs. Before taking the pill, you may have a physical exam and Pap test. Your health care provider may order blood tests. The health care provider will make sure you are a good candidate for oral contraception. Discuss with your health care provider the possible side effects of the OCP you may be prescribed. When starting an OCP, it can take 2 to 3 months for the body to adjust to the changes in hormone levels in your body. Types of oral contraception  The combination pill-This pill contains estrogen and progestin (synthetic progesterone) hormones. The combination pill comes in 21-day, 28-day, or 91-day packs. Some types of combination pills are meant to be taken continuously (365-day pills). With 21-day packs, you do not take pills for 7 days after the last pill. With 28-day packs, the pill is taken every day. The last 7 pills are without hormones. Certain types of pills have more than 21 hormone-containing pills. With 91-day packs, the first 84 pills contain both hormones, and the last 7 pills contain no hormones or contain estrogen only.  The minipill-This pill contains the progesterone hormone only. The pill is taken every day continuously. It is very important to take the pill at the same time each day. The minipill comes in packs of 28 pills. All 28 pills contain the hormone. Advantages of oral  contraceptive pills  Decreases premenstrual symptoms.  Treats menstrual period cramps.  Regulates the menstrual cycle.  Decreases a heavy menstrual flow.  May treatacne, depending on the type of pill.  Treats abnormal uterine bleeding.  Treats polycystic ovarian syndrome.  Treats endometriosis.  Can be used as emergency contraception. Things that can make oral contraceptive pills less effective OCPs can be less effective if:  You forget to take the pill at the same time every day.  You have a stomach or intestinal disease that lessens the absorption of the pill.  You take OCPs with other medicines that make OCPs less effective, such as antibiotics, certain HIV medicines, and some seizure medicines.  You take expired OCPs.  You forget to restart the pill on day 7, when using the packs of 21 pills.  Risks associated with oral contraceptive pills Oral contraceptive pills can sometimes cause side effects, such as:  Headache.  Nausea.  Breast tenderness.  Irregular bleeding or spotting.  Combination pills are also associated with a small increased risk of:  Blood clots.  Heart attack.  Stroke.  This information is not intended to replace advice given to you by your health care provider. Make sure you discuss any questions you have with your health care provider. Document Released: 07/08/2002 Document Revised: 09/23/2015 Document Reviewed: 10/06/2012 Elsevier Interactive Patient Education  2018 Elsevier Inc.  

## 2016-10-31 NOTE — Progress Notes (Signed)
Subjective:     Katelyn Fuentes is a 26 y.o. female who presents for a postpartum visit. She is 4 weeks postpartum following a spontaneous vaginal delivery. I have fully reviewed the prenatal and intrapartum course. The delivery was at 37.0 gestational weeks. Outcome: spontaneous vaginal delivery. Anesthesia: local and epidural. Postpartum course has been normal. Baby's course has been normal. Baby is feeding by breast. Bleeding no bleeding. Bowel function is normal. Bladder function is normal. Patient is not sexually active. Contraception method is oral progesterone-only contraceptive. Postpartum depression screening: negative.  The following portions of the patient's history were reviewed and updated as appropriate: allergies, current medications, past family history, past medical history, past social history, past surgical history and problem list.  Review of Systems Pertinent items are noted in HPI.   Objective:    BP 130/64   Pulse 79   Wt 127 lb 4.8 oz (57.7 kg)   Breastfeeding? Yes   BMI 20.55 kg/m   General:  alert and cooperative   Breasts:  not performed  Lungs: normal breathing  Heart:  normal heart rate  Abdomen: soft, nontender   Vulva:  healing well, some suture still in place, patient does not want removed yet  Vagina: normal vagina  Cervix:  not evaluated  Corpus: not examined  Adnexa:  not evaluated  Rectal Exam: Not performed.        Assessment:     Normal postpartum exam. Pap smear not done at today's visit. Normal pap smear during pregnancy.   Plan:    1. Contraception: oral progesterone-only contraceptive 2. Advised back-up method of birth control for first month of OCPs 3. Follow up in: as needed for routine GYN care or as needed.    Marny LowensteinWenzel, Kaydan Wilhoite N, PA-C 10/31/2016 10:32 AM

## 2017-09-04 ENCOUNTER — Encounter: Payer: Self-pay | Admitting: *Deleted

## 2018-01-23 ENCOUNTER — Emergency Department (HOSPITAL_COMMUNITY)
Admission: EM | Admit: 2018-01-23 | Discharge: 2018-01-24 | Discharge status: Against Medical Advice | Payer: Self-pay | Attending: Emergency Medicine | Admitting: Emergency Medicine

## 2018-01-23 ENCOUNTER — Other Ambulatory Visit: Payer: Self-pay

## 2018-01-23 DIAGNOSIS — Z5321 Procedure and treatment not carried out due to patient leaving prior to being seen by health care provider: Secondary | ICD-10-CM | POA: Insufficient documentation

## 2018-01-23 NOTE — ED Notes (Signed)
Per registration, pt LWBS 

## 2018-01-23 NOTE — ED Triage Notes (Signed)
Pt coming from home c/o migraine that started 3 days ago with no relief despite medication. Also c/o of nosebleeds. Pt currently not bleeding in triage

## 2018-05-27 ENCOUNTER — Encounter (HOSPITAL_COMMUNITY): Payer: Self-pay | Admitting: Emergency Medicine

## 2018-05-27 ENCOUNTER — Emergency Department (HOSPITAL_COMMUNITY)
Admission: EM | Admit: 2018-05-27 | Discharge: 2018-05-27 | Disposition: A | Discharge status: Home / Self Care | Payer: Self-pay | Attending: Emergency Medicine | Admitting: Emergency Medicine

## 2018-05-27 DIAGNOSIS — N12 Tubulo-interstitial nephritis, not specified as acute or chronic: Secondary | ICD-10-CM | POA: Insufficient documentation

## 2018-05-27 DIAGNOSIS — Z79899 Other long term (current) drug therapy: Secondary | ICD-10-CM | POA: Insufficient documentation

## 2018-05-27 DIAGNOSIS — J45909 Unspecified asthma, uncomplicated: Secondary | ICD-10-CM | POA: Insufficient documentation

## 2018-05-27 LAB — URINALYSIS, ROUTINE W REFLEX MICROSCOPIC
Bilirubin Urine: NEGATIVE
Glucose, UA: NEGATIVE mg/dL
KETONES UR: NEGATIVE mg/dL
NITRITE: NEGATIVE
PROTEIN: 100 mg/dL — AB
Specific Gravity, Urine: 1.021 (ref 1.005–1.030)
pH: 5 (ref 5.0–8.0)

## 2018-05-27 LAB — CBC WITH DIFFERENTIAL/PLATELET
Abs Immature Granulocytes: 0.03 10*3/uL (ref 0.00–0.07)
BASOS ABS: 0 10*3/uL (ref 0.0–0.1)
Basophils Relative: 0 %
Eosinophils Absolute: 0 10*3/uL (ref 0.0–0.5)
Eosinophils Relative: 0 %
HCT: 34 % — ABNORMAL LOW (ref 36.0–46.0)
Hemoglobin: 10.8 g/dL — ABNORMAL LOW (ref 12.0–15.0)
Immature Granulocytes: 0 %
Lymphocytes Relative: 24 %
Lymphs Abs: 1.6 10*3/uL (ref 0.7–4.0)
MCH: 28.6 pg (ref 26.0–34.0)
MCHC: 31.8 g/dL (ref 30.0–36.0)
MCV: 89.9 fL (ref 80.0–100.0)
Monocytes Absolute: 0.9 10*3/uL (ref 0.1–1.0)
Monocytes Relative: 13 %
NEUTROS ABS: 4.3 10*3/uL (ref 1.7–7.7)
NRBC: 0 % (ref 0.0–0.2)
Neutrophils Relative %: 63 %
Platelets: 228 10*3/uL (ref 150–400)
RBC: 3.78 MIL/uL — ABNORMAL LOW (ref 3.87–5.11)
RDW: 12.8 % (ref 11.5–15.5)
WBC: 6.9 10*3/uL (ref 4.0–10.5)

## 2018-05-27 LAB — BASIC METABOLIC PANEL
Anion gap: 11 (ref 5–15)
BUN: 11 mg/dL (ref 6–20)
CO2: 26 mmol/L (ref 22–32)
Calcium: 9.7 mg/dL (ref 8.9–10.3)
Chloride: 100 mmol/L (ref 98–111)
Creatinine, Ser: 0.72 mg/dL (ref 0.44–1.00)
GFR calc Af Amer: >60 mL/min (ref >60)
GFR calc non Af Amer: >60 mL/min (ref >60)
Glucose, Bld: 88 mg/dL (ref 70–99)
POTASSIUM: 3.3 mmol/L — AB (ref 3.5–5.1)
Sodium: 137 mmol/L (ref 135–145)

## 2018-05-27 LAB — POC URINE PREG, ED: Preg Test, Ur: NEGATIVE

## 2018-05-27 MED ORDER — ONDANSETRON 4 MG PO TBDP: 4.0000 mg | ORAL_TABLET | Freq: Three times a day (TID) | ORAL | 0 refills | Status: AC | PRN

## 2018-05-27 MED ORDER — CEPHALEXIN 500 MG PO CAPS: 500.0000 mg | ORAL_CAPSULE | Freq: Four times a day (QID) | ORAL | 0 refills | Status: AC

## 2018-05-27 MED ORDER — NAPROXEN 500 MG PO TABS: 500.0000 mg | ORAL_TABLET | Freq: Two times a day (BID) | ORAL | 0 refills | Status: AC

## 2018-05-27 MED ORDER — SODIUM CHLORIDE 0.9 % IV BOLUS
1000.0000 mL | Freq: Once | INTRAVENOUS | Status: AC
  Administered 2018-05-27: 1000 mL via INTRAVENOUS

## 2018-05-27 MED ORDER — ONDANSETRON HCL 4 MG/2ML IJ SOLN
4.0000 mg | Freq: Once | INTRAMUSCULAR | Status: AC
  Administered 2018-05-27: 4 mg via INTRAVENOUS
  Filled 2018-05-27: qty 2

## 2018-05-27 MED ORDER — ACETAMINOPHEN 325 MG PO TABS
650.0000 mg | ORAL_TABLET | Freq: Once | ORAL | Status: AC
  Administered 2018-05-27: 650 mg via ORAL
  Filled 2018-05-27: qty 2

## 2018-05-27 MED ORDER — SODIUM CHLORIDE 0.9 % IV SOLN
1.0000 g | Freq: Once | INTRAVENOUS | Status: AC
  Administered 2018-05-27: 1 g via INTRAVENOUS
  Filled 2018-05-27: qty 10

## 2018-05-27 MED ORDER — POTASSIUM CHLORIDE CRYS ER 20 MEQ PO TBCR
40.0000 meq | EXTENDED_RELEASE_TABLET | Freq: Once | ORAL | Status: AC
  Administered 2018-05-27: 40 meq via ORAL
  Filled 2018-05-27: qty 2

## 2018-05-27 MED ORDER — KETOROLAC TROMETHAMINE 15 MG/ML IJ SOLN
15.0000 mg | Freq: Once | INTRAMUSCULAR | Status: AC
  Administered 2018-05-27: 15 mg via INTRAVENOUS
  Filled 2018-05-27: qty 1

## 2018-05-27 NOTE — ED Notes (Signed)
Lavender tube recollected 1935

## 2018-05-27 NOTE — ED Triage Notes (Signed)
Pt c/o right flank pain that radiates up back to shoulders for 3 days. Reports that has nausea and dysuria.

## 2018-05-27 NOTE — ED Provider Notes (Signed)
[facility]-EMERGENCY DEPT Provider Note   CSN: [id number] Arrival date & time: 05/27/18  1425     History   Chief Complaint Chief Complaint  Patient presents with  . Back Pain  . Flank Pain    HPI Katelyn Fuentes is a 28 y.o. female with a history of anemia, SVT, anxiety, and depression who presents to the emergency department with complaints of right flank pain for the past 3 days.  She states that the pain is in the right flank, dull/achy in nature, constant, and without specific alleviating or aggravating factors.  She notes associated nausea, urinary urgency, urinary frequency, and chills.  No history of similar.  Denies fever, vomiting, abdominal pain, diarrhea, hematuria, vaginal bleeding, or vaginal discharge.  She is sexually active with one female partner and is not concerned for STD.  HPI  Past Medical History:  Diagnosis Date  . Anemia   . Asthma    childhood  . Mitral valve prolapse   . Postpartum hemorrhage   . Preterm labor   . Sinus arrhythmia   . Sinus bradycardia   . SVT (supraventricular tachycardia) [facility](HCC)     Patient Active Problem List   Diagnosis Date Noted  . Sinus arrhythmia   . Sinus bradycardia   . Anemia   . Preterm contractions 08/03/2016  . Mitral valve anterior leaflet prolapse 12/09/2014  . SVT (supraventricular tachycardia) (HCC) 12/08/2014  . Anxiety 12/17/2012  . Depression 12/17/2012  . History of postpartum hemorrhage 04/12/2011    Past Surgical History:  Procedure Laterality Date  . NO PAST SURGERIES       OB History    Gravida  2   Para  2   Term  2   Preterm  0   AB  0   Living  2     SAB  0   TAB  0   Ectopic  0   Multiple  0   Live Births  2            Home Medications    Prior to Admission medications   Medication Sig Start Date End Date Taking? Authorizing Provider  ibuprofen (ADVIL,MOTRIN) 200 MG tablet Take 200-400 mg by mouth every 6 (six) hours as needed  for moderate pain.   Yes [provider]  Multiple Vitamin (MULTIVITAMIN WITH MINERALS) TABS tablet Take 1 tablet by mouth daily.   Yes [provider]  ibuprofen (ADVIL,MOTRIN) 600 MG tablet Take 1 tablet (600 mg total) by mouth every 6 (six) hours. Patient not taking: Reported on 10/31/2016 10/01/16   Leftwich-Kirby, Wilmer FloorLisa A, CNM  norethindrone (ORTHO MICRONOR) 0.35 MG tablet Take 1 tablet (0.35 mg total) by mouth daily. Patient not taking: Reported on 05/27/2018 10/31/16   Marny LowensteinWenzel, Julie N, PA-C    Family History Family History  Problem Relation Age of Onset  . Heart disease Mother   . Hypertension Mother   . Heart disease Maternal Grandmother   . Hypertension Maternal Grandmother   . Diabetes Neg Hx   . Heart failure Neg Hx   . Hyperlipidemia Neg Hx     Social History Social History   Tobacco Use  . Smoking status: Never Smoker  . Smokeless tobacco: Never Used  Substance Use Topics  . Alcohol use: Yes    Comment: occasional  . Drug use: No     Allergies   Peach seed   Review of Systems Review of Systems  Constitutional: Positive for chills. Negative  for fever.  Respiratory: Negative for shortness of breath.   Cardiovascular: Negative for chest pain.  Gastrointestinal: Positive for nausea. Negative for abdominal pain, constipation, diarrhea and vomiting.  Genitourinary: Positive for flank pain, frequency and urgency. Negative for dysuria, hematuria, vaginal bleeding and vaginal discharge.  Neurological: Negative for syncope.  All other systems reviewed and are negative.    Physical Exam Updated Vital Signs BP 118/72 (BP Location: Left Arm)   Pulse 93   Temp 98.6 F (37 C) (Oral)   Resp 17   LMP 05/13/2018   SpO2 100%   Physical Exam Vitals signs and nursing note reviewed.  Constitutional:      General: She is not in acute distress.    Appearance: She is well-developed. She is not toxic-appearing.  HENT:     Head: Normocephalic and  atraumatic.  Eyes:     General:        Right eye: No discharge.        Left eye: No discharge.     Conjunctiva/sclera: Conjunctivae normal.  Neck:     Musculoskeletal: Neck supple.  Cardiovascular:     Rate and Rhythm: Normal rate and regular rhythm.  Pulmonary:     Effort: Pulmonary effort is normal. No respiratory distress.     Breath sounds: Normal breath sounds. No wheezing, rhonchi or rales.  Abdominal:     General: There is no distension.     Palpations: Abdomen is soft.     Tenderness: There is no abdominal tenderness. There is no right CVA tenderness, left CVA tenderness, guarding or rebound.  Skin:    General: Skin is warm and dry.     Findings: No rash.  Neurological:     Mental Status: She is alert.     Comments: Clear speech.   Psychiatric:        Behavior: Behavior normal.    ED Treatments / Results  Labs (all labs ordered are listed, but only abnormal results are displayed) Labs Reviewed  URINALYSIS, ROUTINE W REFLEX MICROSCOPIC - Abnormal; Notable for the following components:      Result Value   APPearance CLOUDY (*)    Hgb urine dipstick MODERATE (*)    Protein, ur 100 (*)    Leukocytes, UA LARGE (*)    WBC, UA >50 (*)    Bacteria, UA MANY (*)    All other components within normal limits  BASIC METABOLIC PANEL - Abnormal; Notable for the following components:   Potassium 3.3 (*)    All other components within normal limits  CBC WITH DIFFERENTIAL/PLATELET - Abnormal; Notable for the following components:   RBC 3.78 (*)    Hemoglobin 10.8 (*)    HCT 34.0 (*)    All other components within normal limits  URINE CULTURE  CBC WITH DIFFERENTIAL/PLATELET  POC URINE PREG, ED    EKG None  Radiology No results found.  Procedures Procedures (including critical care time)  Medications Ordered in ED Medications  sodium chloride 0.9 % bolus 1,000 mL (1,000 mLs Intravenous New Bag/Given 05/27/18 1808)  cefTRIAXone (ROCEPHIN) 1 g in sodium chloride 0.9  % 100 mL IVPB (1 g Intravenous New Bag/Given 05/27/18 1947)  ondansetron (ZOFRAN) injection 4 mg (4 mg Intravenous Given 05/27/18 1945)  acetaminophen (TYLENOL) tablet 650 mg (650 mg Oral Given 05/27/18 1945)  ketorolac (TORADOL) 15 MG/ML injection 15 mg (15 mg Intravenous Given 05/27/18 1946)  potassium chloride SA (K-DUR,KLOR-CON) CR tablet 40 mEq (40 mEq Oral Given 05/27/18 1945)  Initial Impression / Assessment and Plan / ED Course  I have reviewed the triage vital signs and the nursing notes.  Pertinent labs & imaging results that were available during my care of the patient were reviewed by me and considered in my medical decision making (see chart for details).   Patient presents to the emergency department with right flank pain with associated urinary symptoms, nausea, and chills.  Nontoxic appearing, no apparent distress, vitals WNL. Benign physical exam, nontender abdomen, doubt pancreatitis, cholecystitis, appendicitis, obstruction/perforation, PID, or ectopic pregnancy. Pain is not colicky in nature- feel that nephrolithiasis is less likely. Further evaluate with basic labs, UA & preg test.   Work-up reviewed:  CBC: No leukocytosis. Baseline anemia.  BMP: Mild hypokalemia @ 3.3- orally replaced in the ER, discharge w/ diet instructions. Renal function preserved UA: Consistent with infection.   Suspect pyelonephritis w/ infected urine and flank pain. Renal function preserved, not septic appearing, tolerating PO. Given Rocephin in the ER. She states she is feeling much better following medications as above. Will discharge home with Keflex (pending culture) and additional supportive measures with PCP follow up. I discussed results, treatment plan, need for follow-up, and return precautions with the patient. Provided opportunity for questions, patient confirmed understanding and is in agreement with plan.    Final Clinical Impressions(s) / ED Diagnoses   Final diagnoses:    Pyelonephritis    ED Discharge Orders         Ordered    naproxen (NAPROSYN) 500 MG tablet  2 times daily     05/27/18 2019    ondansetron (ZOFRAN ODT) 4 MG disintegrating tablet  Every 8 hours PRN     05/27/18 2019    cephALEXin (KEFLEX) 500 MG capsule  4 times daily     05/27/18 2019           Cherly Andersonetrucelli, Brack Shaddock R, PA-C 05/27/18 2023    Pricilla LovelessGoldston, Scott, MD 05/27/18 2337

## 2018-05-27 NOTE — Discharge Instructions (Addendum)
You are seen in the emergency department and diagnosed with pyelonephritis-please see the attached handout for further information regarding this diagnosis.  Your blood work showed that your hemoglobin is a bit low consistent with anemia, and also showed that your potassium was a bit low, please follow the attached guidelines for potassium in your diet.  Please have each of these blood work repeated by your primary care provider in the next 1 to 2 weeks.  Regarding your pyelonephritis we are treating this with Keflex, an antibiotic, please take this 4 times per day for the next 7 days.  We are also sending you home with Zofran, and antinausea medicine to be taken every 8 hours as needed for nausea and vomiting.  We are sending you home with naproxen to help with pain, this is an anti-inflammatory medicine, be sure to take this with food as it can cause stomach upset and it were stomach bleeding, do not take other NSAIDs with this medicine such as Motrin, Aleve, Mobic, ibuprofen etc.    You can take Tylenol with this medicine safely.  We have prescribed you new medication(s) today. Discuss the medications prescribed today with your pharmacist as they can have adverse effects and interactions with your other medicines including over the counter and prescribed medications. Seek medical evaluation if you start to experience new or abnormal symptoms after taking one of these medicines, seek care immediately if you start to experience difficulty breathing, feeling of your throat closing, facial swelling, or rash as these could be indications of a more serious allergic reaction  Please follow-up with your primary care provider within the next 3 to 4 days.  Please return to the ER for new or worsening symptoms including but not limited to fevers, inability to keep fluids down, worsening pain, or any other concerns.

## 2018-05-30 LAB — URINE CULTURE: Culture: >=100000 — AB

## 2018-05-31 ENCOUNTER — Telehealth: Payer: Self-pay | Admitting: *Deleted

## 2018-05-31 NOTE — Telephone Encounter (Signed)
Post ED Visit - Positive Culture Follow-up  Culture report reviewed by antimicrobial stewardship pharmacist:  []  Enzo Bi, Pharm.D. []  Celedonio Miyamoto, Pharm.D., BCPS AQ-ID []  Garvin Fila, Pharm.D., BCPS []  Georgina Pillion, [address].D., BCPS []  [place], [address].D., BCPS, AAHIVP []  Estella Husk, Pharm.D., BCPS, AAHIVP [x]  Lysle Pearl, PharmD, BCPS []  Phillips Climes, PharmD, BCPS []  Agapito Games, PharmD, BCPS []  Verlan Friends, PharmD  Positive urine culture Treated with Cephalexin, organism sensitive to the same and no further patient follow-up is required at this time.  Virl Axe [facility] 05/31/2018, 12:09 PM

## 2018-07-31 IMAGING — US US MFM OB FOLLOW-UP
1 series · 14 of 28 positions shown · non-contrast
Comparison: none

[Series 1: us mfm ob follow-up · 14 of 60 slices shown]
[im 3/60]
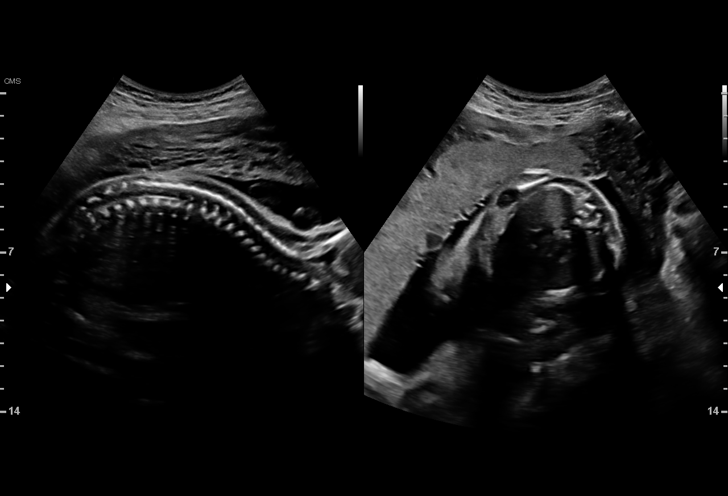
[im 7/60]
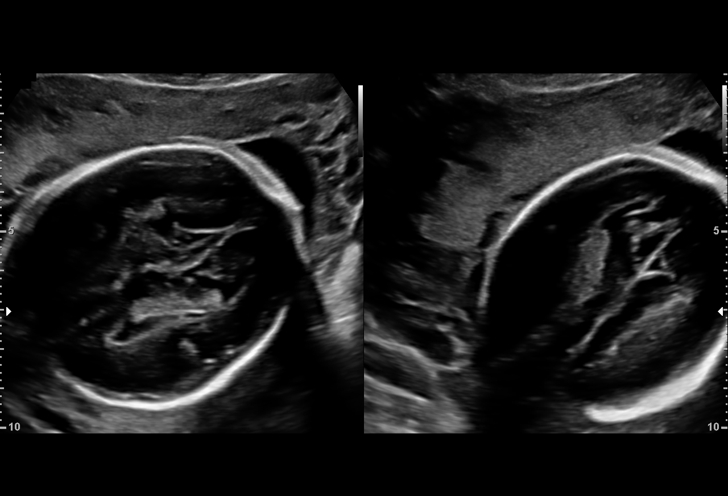
[im 11/60]
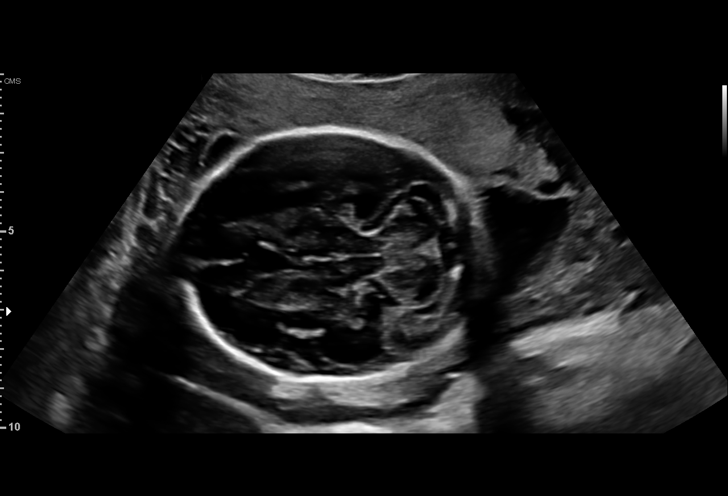
[im 16/60]
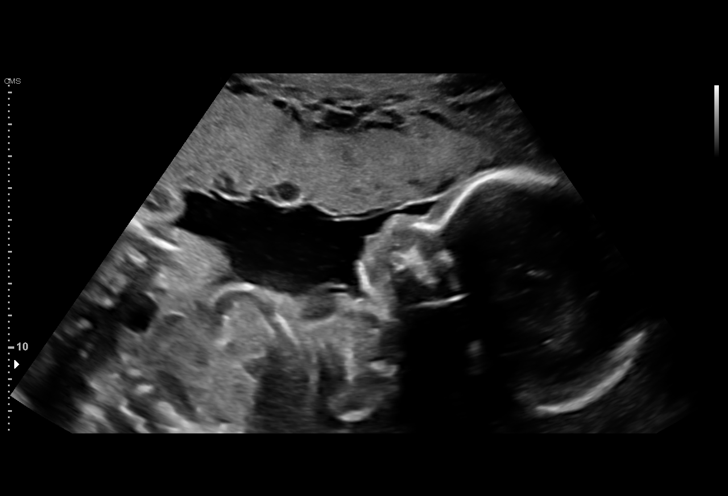
[im 20/60]
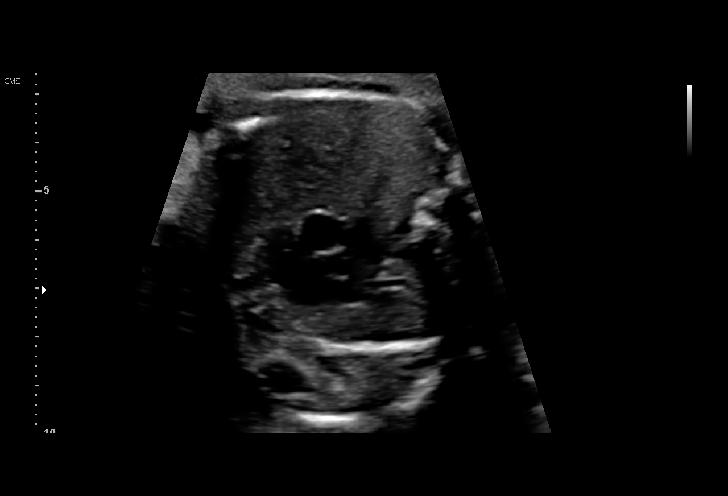
[im 25/60]
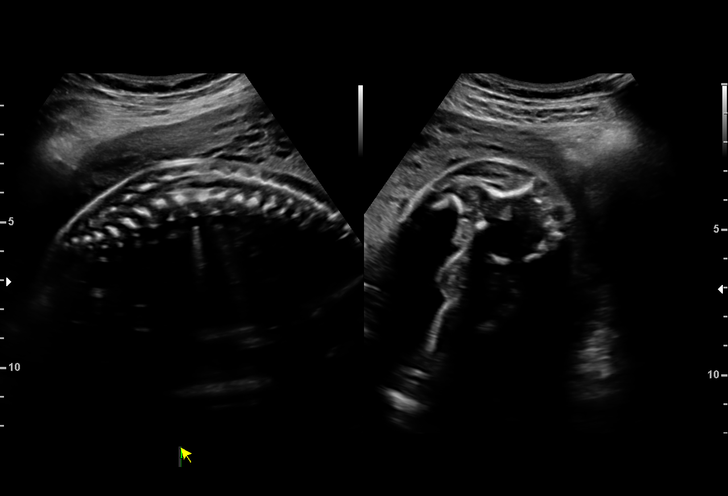
[im 29/60]
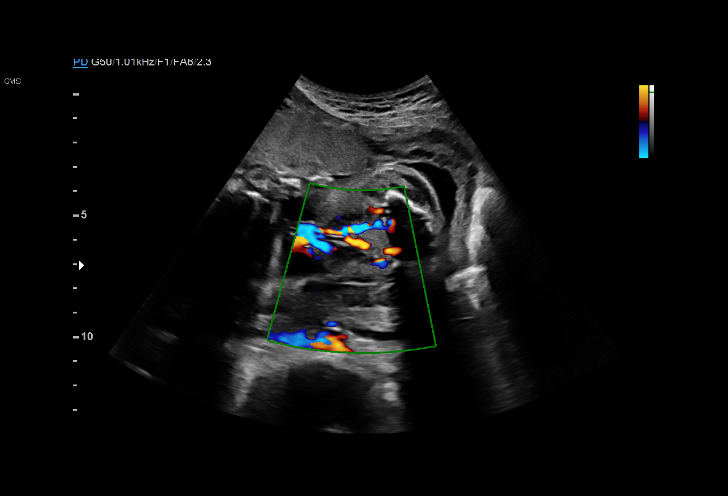
[im 33/60]
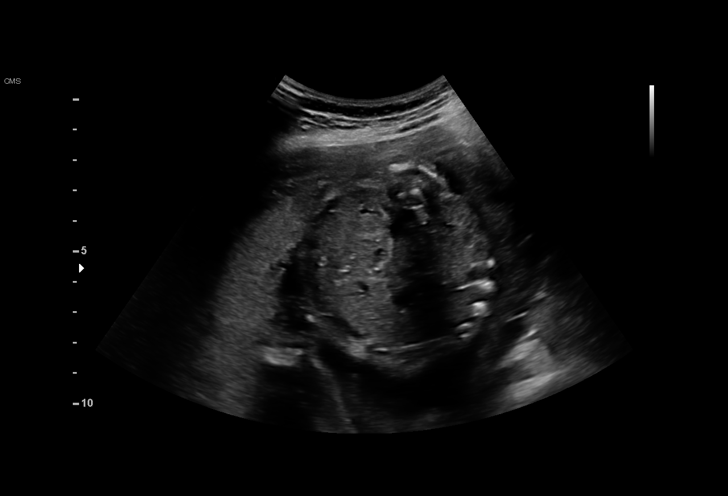
[im 38/60]
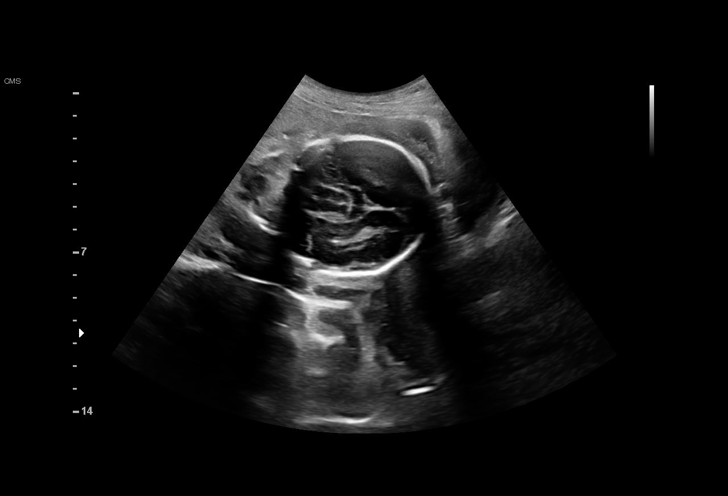
[im 42/60]
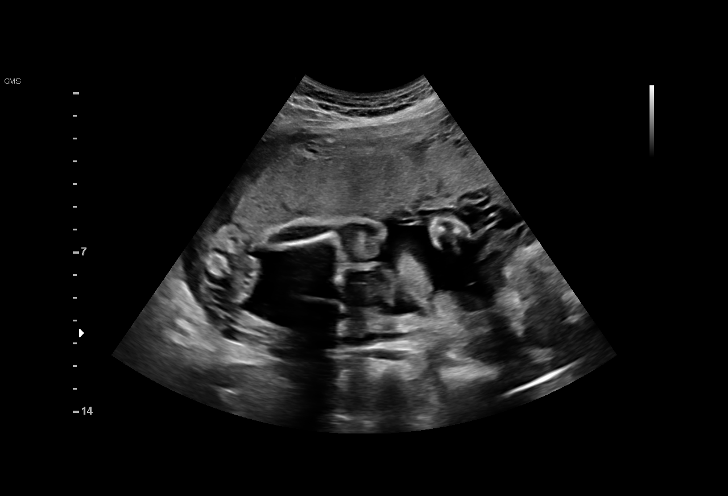
[im 46/60]
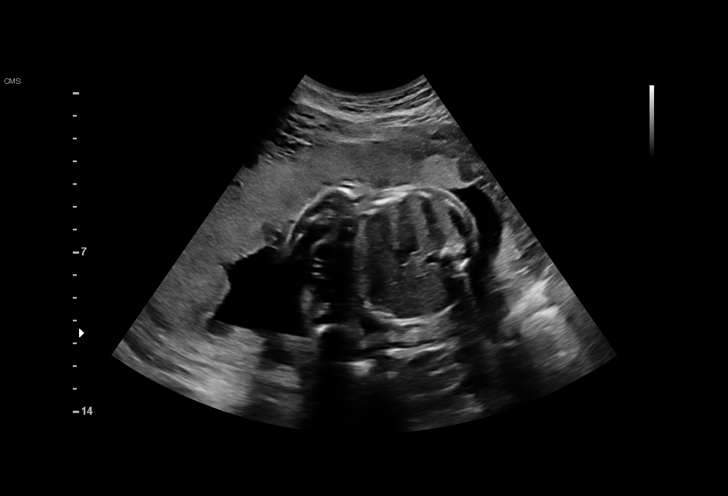
[im 51/60]
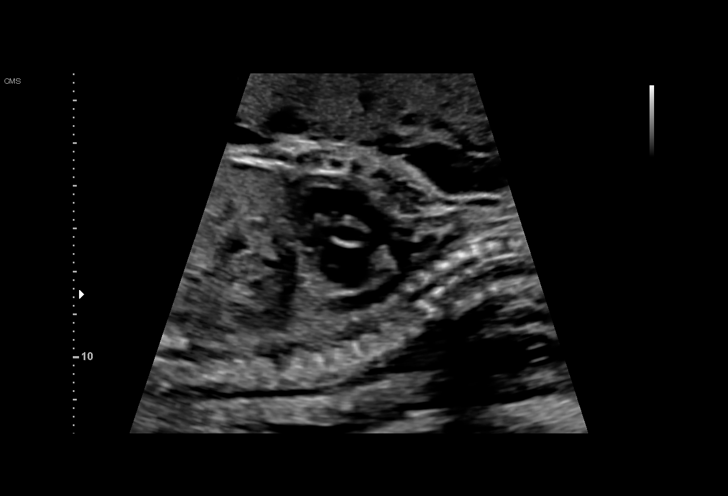
[im 55/60]
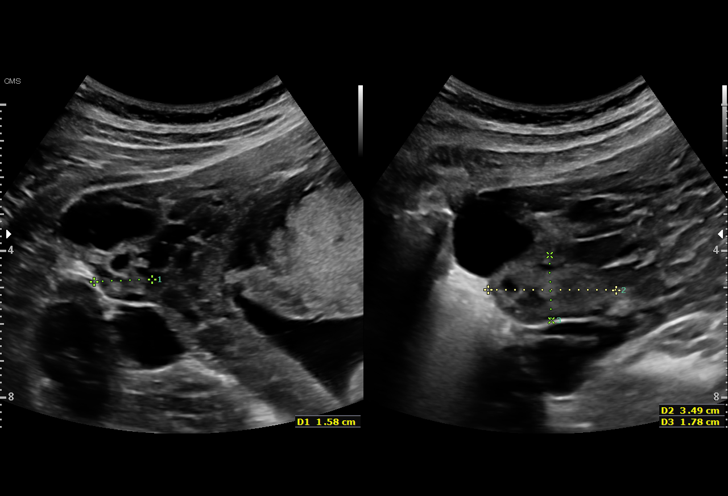
[im 60/60]
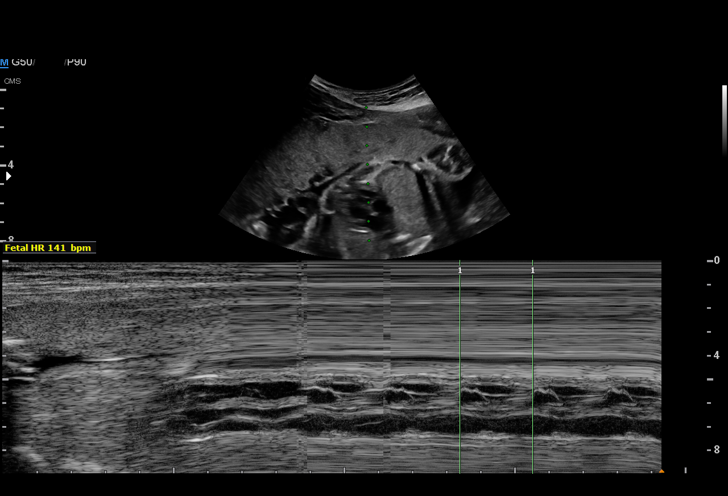

[14 of 28 positions shown; findings below may reference images not displayed]

OB/Gyn Clinic

1  NARIN OY          [id number]      [id number]     [id number]
Indications

25 weeks gestation of pregnancy
Antenatal follow-up for nonvisualized fetal
anatomy
Medical complication of pregnancy
(MVP/SVT)
OB History

Gravidity:    2         Term:   1        Prem:   0        SAB:   0
TOP:          0       Ectopic:  0        Living: 1
Fetal Evaluation

Num Of Fetuses:     1
Fetal Heart         141
Rate(bpm):
Cardiac Activity:   Observed
Presentation:       Cephalic
Placenta:           Anterior, above cervical os
P. Cord Insertion:  Visualized, central

Amniotic Fluid
AFI FV:      Subjectively within normal limits
Largest Pocket(cm)
3.39
Biometry

BPD:      63.2  mm     G. Age:  25w 4d         58  %    CI:        71.24   %   70 - 86
FL/HC:      20.0   %   18.7 -
HC:      238.5  mm     G. Age:  26w 0d         56  %    HC/AC:      1.10       1.04 -
AC:      216.9  mm     G. Age:  26w 1d         73  %    FL/BPD:     75.3   %   71 - 87
FL:       47.6  mm     G. Age:  26w 0d         60  %    FL/AC:      21.9   %   20 - 24
HUM:      45.2  mm     G. Age:  26w 5d         83  %
Est. FW:     882  gm    1 lb 15 oz      71  %
Gestational Age

LMP:           25w 1d       Date:   01/18/16                 EDD:   10/24/16
U/S Today:     26w 0d                                        EDD:   10/18/16
Best:          25w 1d    Det. By:   LMP  (01/18/16)          EDD:   10/24/16
Anatomy

Cranium:               Appears normal         Aortic Arch:            Appears normal
Cavum:                 Appears normal         Ductal Arch:            Appears normal
Ventricles:            Appears normal         Diaphragm:              Previously seen
Choroid Plexus:        Appears normal         Stomach:                Appears normal, left
sided
Cerebellum:            Appears normal         Abdomen:                Appears normal
Posterior Fossa:       Appears normal         Abdominal Wall:         Appears nml (cord
insert, abd wall)
Nuchal Fold:           Previously seen        Cord Vessels:           Appears normal (3
vessel cord)
Face:                  Orbits nl; profile     Kidneys:                Appear normal
prev visualized
Lips:                  Appears normal         Bladder:                Appears normal
Thoracic:              Appears normal         Spine:                  Appears normal
Heart:                 Appears normal         Upper Extremities:      Previously seen
(4CH, axis, and situs
RVOT:                  Appears normal         Lower Extremities:      Previously seen
LVOT:                  Appears normal

Other:  Fetus appears to be a male. Heels and 5th digit previously visualized.
Nasal bone visualized. Technically difficult due to fetal position.
Cervix Uterus Adnexa

Cervix
Length:            4.5  cm.
Normal appearance by transabdominal scan.

Uterus
No abnormality visualized.

Left Ovary
Not visualized.

Right Ovary
Within normal limits.

Cul De Sac:   No free fluid seen.

Adnexa:       No abnormality visualized.
Impression

Single IUP at 25w 1d
Normal interval anatomy
Interval fetal growth is appropriae (71st %tile)
Anterior placenta without previa
Normal amniotic fluid volume
Recommendations

Follow-up ultrasounds as clinically indicated.

## 2024-05-28 ENCOUNTER — Other Ambulatory Visit: Payer: Self-pay

## 2024-05-28 DIAGNOSIS — N644 Mastodynia: Secondary | ICD-10-CM

## 2024-05-28 DIAGNOSIS — N6320 Unspecified lump in the left breast, unspecified quadrant: Secondary | ICD-10-CM

## 2024-06-26 ENCOUNTER — Ambulatory Visit: Payer: Self-pay

## 2024-06-26 ENCOUNTER — Other Ambulatory Visit: Payer: Self-pay
